# Patient Record
Sex: Female | Born: 1940 | Race: White | Hispanic: No | Smoking: Never smoker
Health system: Southern US, Community
[De-identification: ages and names within clinical notes are randomized; demographics above are authoritative.]

## PROBLEM LIST (undated history)

## (undated) DIAGNOSIS — N390 Urinary tract infection, site not specified: Secondary | ICD-10-CM

## (undated) DIAGNOSIS — I2699 Other pulmonary embolism without acute cor pulmonale: Secondary | ICD-10-CM

## (undated) DIAGNOSIS — J45909 Unspecified asthma, uncomplicated: Secondary | ICD-10-CM

## (undated) DIAGNOSIS — E785 Hyperlipidemia, unspecified: Secondary | ICD-10-CM

## (undated) DIAGNOSIS — F419 Anxiety disorder, unspecified: Secondary | ICD-10-CM

## (undated) DIAGNOSIS — I10 Essential (primary) hypertension: Secondary | ICD-10-CM

## (undated) DIAGNOSIS — K219 Gastro-esophageal reflux disease without esophagitis: Secondary | ICD-10-CM

## (undated) DIAGNOSIS — M797 Fibromyalgia: Secondary | ICD-10-CM

## (undated) DIAGNOSIS — G43909 Migraine, unspecified, not intractable, without status migrainosus: Secondary | ICD-10-CM

## (undated) DIAGNOSIS — M810 Age-related osteoporosis without current pathological fracture: Secondary | ICD-10-CM

## (undated) DIAGNOSIS — N83209 Unspecified ovarian cyst, unspecified side: Secondary | ICD-10-CM

## (undated) DIAGNOSIS — F32A Depression, unspecified: Secondary | ICD-10-CM

## (undated) DIAGNOSIS — F329 Major depressive disorder, single episode, unspecified: Secondary | ICD-10-CM

## (undated) HISTORY — PX: MEDIAL PARTIAL KNEE REPLACEMENT: SHX5965

## (undated) HISTORY — DX: Depression, unspecified: F32.A

## (undated) HISTORY — DX: Unspecified asthma, uncomplicated: J45.909

## (undated) HISTORY — PX: NECK SURGERY: SHX720

## (undated) HISTORY — PX: BACK SURGERY: SHX140

## (undated) HISTORY — DX: Hyperlipidemia, unspecified: E78.5

## (undated) HISTORY — PX: HYSTERECTOMY ABDOMINAL WITH SALPINGECTOMY: SHX6725

## (undated) HISTORY — DX: Unspecified ovarian cyst, unspecified side: N83.209

## (undated) HISTORY — DX: Age-related osteoporosis without current pathological fracture: M81.0

## (undated) HISTORY — PX: APPENDECTOMY: SHX54

## (undated) HISTORY — DX: Migraine, unspecified, not intractable, without status migrainosus: G43.909

## (undated) HISTORY — PX: CHOLECYSTECTOMY, LAPAROSCOPIC: SHX56

## (undated) HISTORY — DX: Urinary tract infection, site not specified: N39.0

## (undated) HISTORY — PX: FEMUR FRACTURE SURGERY: SHX633

## (undated) HISTORY — DX: Essential (primary) hypertension: I10

## (undated) HISTORY — PX: TONSILLECTOMY AND ADENOIDECTOMY: SHX28

## (undated) HISTORY — PX: CHOLECYSTECTOMY: SHX55

## (undated) HISTORY — DX: Anxiety disorder, unspecified: F41.9

## (undated) HISTORY — DX: Gastro-esophageal reflux disease without esophagitis: K21.9

## (undated) HISTORY — DX: Other pulmonary embolism without acute cor pulmonale: I26.99

## (undated) HISTORY — PX: VAGINAL HYSTERECTOMY: SUR661

## (undated) HISTORY — DX: Fibromyalgia: M79.7

## (undated) HISTORY — PX: OTHER SURGICAL HISTORY: SHX169

## (undated) HISTORY — PX: PARTIAL HIP ARTHROPLASTY: SHX733

---
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
## 1998-10-02 ENCOUNTER — Other Ambulatory Visit: Admission: RE | Admit: 1998-10-02 | Discharge: 1998-10-02 | Payer: Self-pay | Admitting: Family Medicine

## 2002-06-26 ENCOUNTER — Inpatient Hospital Stay (HOSPITAL_COMMUNITY): Admission: EM | Admit: 2002-06-26 | Discharge: 2002-07-01 | Payer: Self-pay | Admitting: Psychiatry

## 2002-07-02 ENCOUNTER — Other Ambulatory Visit (HOSPITAL_COMMUNITY): Admission: RE | Admit: 2002-07-02 | Discharge: 2002-07-16 | Payer: Self-pay | Admitting: Psychiatry

## 2004-11-12 ENCOUNTER — Ambulatory Visit: Payer: Self-pay | Admitting: Family Medicine

## 2005-07-11 ENCOUNTER — Ambulatory Visit: Payer: Self-pay | Admitting: Family Medicine

## 2005-07-21 ENCOUNTER — Inpatient Hospital Stay (HOSPITAL_COMMUNITY): Admission: RE | Admit: 2005-07-21 | Discharge: 2005-07-27 | Payer: Self-pay | Admitting: Orthopedic Surgery

## 2005-07-21 ENCOUNTER — Ambulatory Visit: Payer: Self-pay | Admitting: Cardiovascular Disease

## 2005-08-15 ENCOUNTER — Ambulatory Visit: Payer: Self-pay | Admitting: Family Medicine

## 2007-10-23 ENCOUNTER — Inpatient Hospital Stay (HOSPITAL_COMMUNITY): Admission: RE | Admit: 2007-10-23 | Discharge: 2007-10-31 | Payer: Self-pay | Admitting: Orthopedic Surgery

## 2007-10-25 ENCOUNTER — Ambulatory Visit: Payer: Self-pay | Admitting: Vascular Surgery

## 2007-10-25 ENCOUNTER — Encounter (INDEPENDENT_AMBULATORY_CARE_PROVIDER_SITE_OTHER): Payer: Self-pay | Admitting: Orthopedic Surgery

## 2007-10-29 ENCOUNTER — Ambulatory Visit: Payer: Self-pay | Admitting: Physical Medicine & Rehabilitation

## 2008-10-27 IMAGING — CT CT ANGIO CHEST
2 of 5 series · 19 of 36 positions shown · IV contrast (APPLIED)
Comparison: None

CLINICAL DATA: Chest pain, recent knee surgery

CT ANGIOGRAPHY CHEST
TECHNIQUE: Multidetector CT imaging of the chest using the
standard protocol during bolus administration of intravenous
contrast. Multiplanar reconstructed images obtained and reviewed to
evaluate the vascular anatomy.
Contrast: 80 ml Omnipaque 350

[Series 8: pulm embolism 1.0 b25f thins · axial · 0.63mm/px · z∈[-274,-34]mm · 16 of 267 slices shown]
[im 14/267  lung]
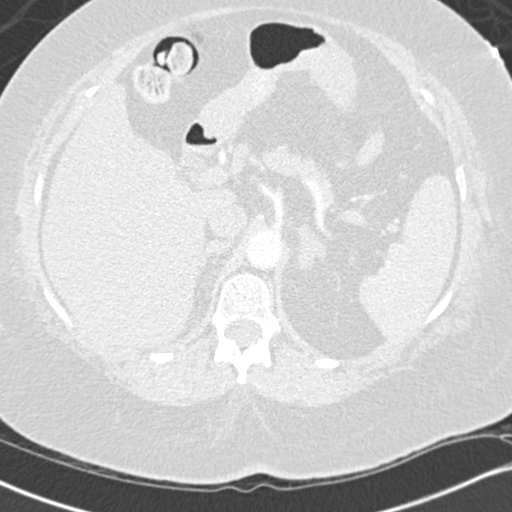
[im 27/267  mediastinal]
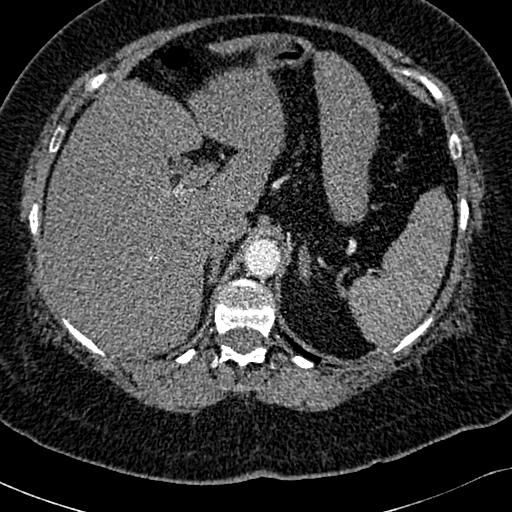
[im 40/267  lung]
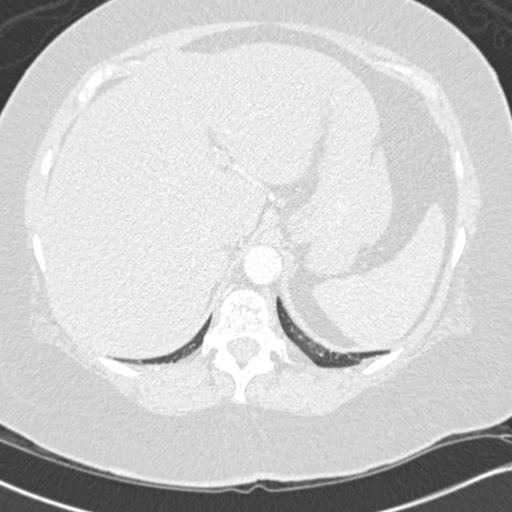
[im 67/267  mediastinal]
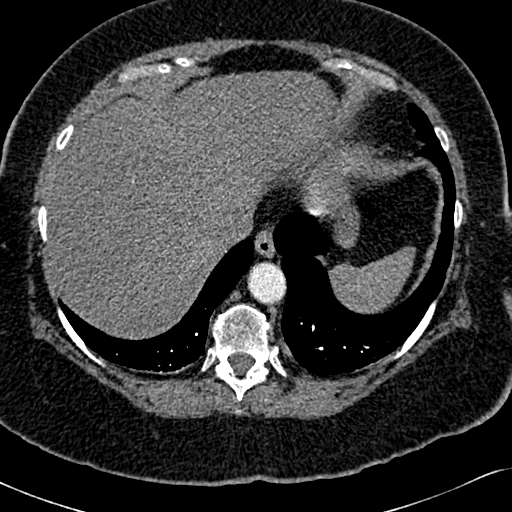
[im 80/267  lung]
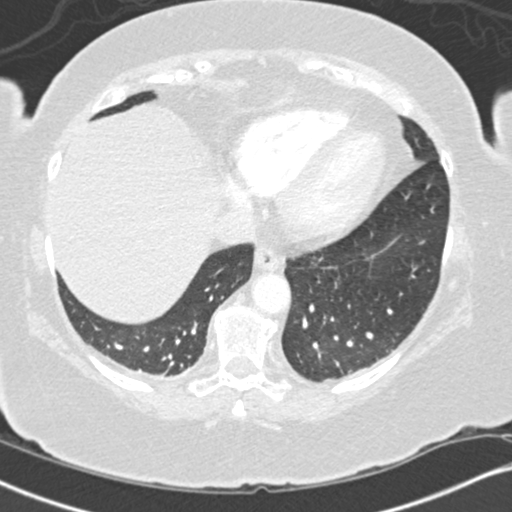
[im 94/267  mediastinal]
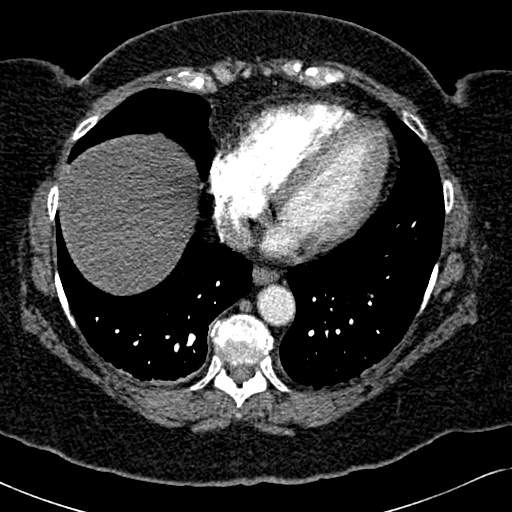
[im 107/267  lung]
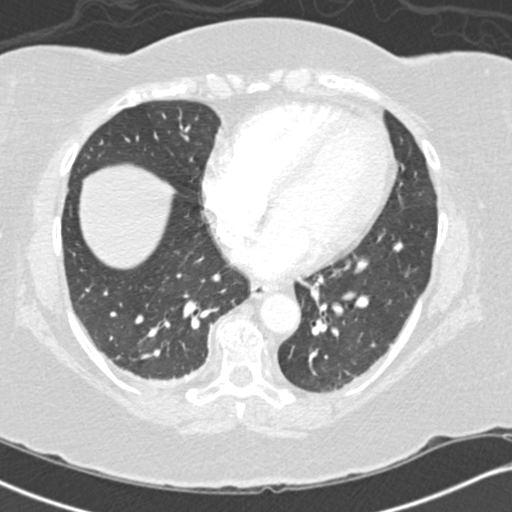
[im 120/267  mediastinal]
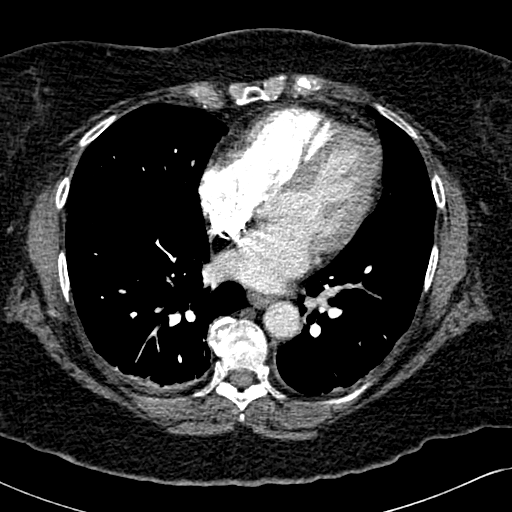
[im 147/267  lung]
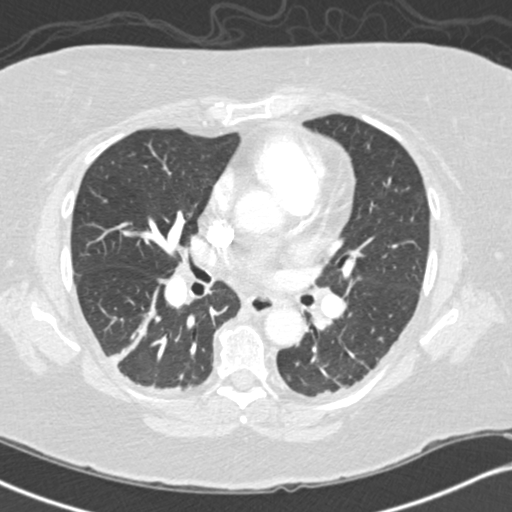
[im 160/267  mediastinal]
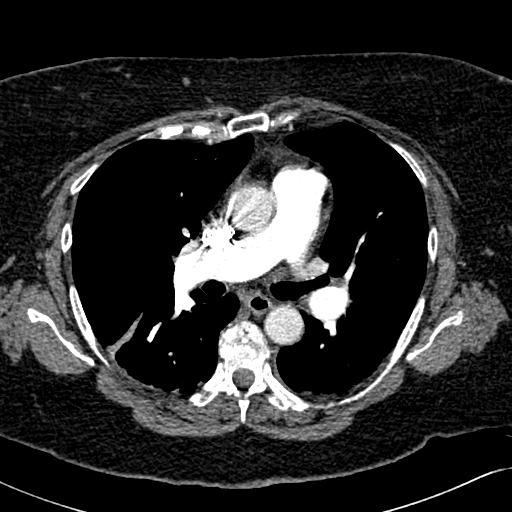
[im 173/267  lung]
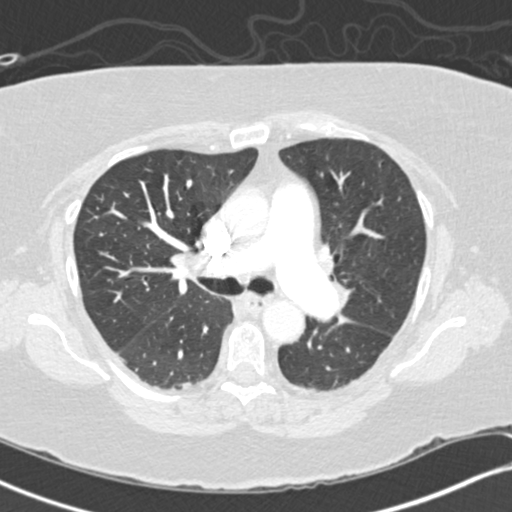
[im 187/267  mediastinal]
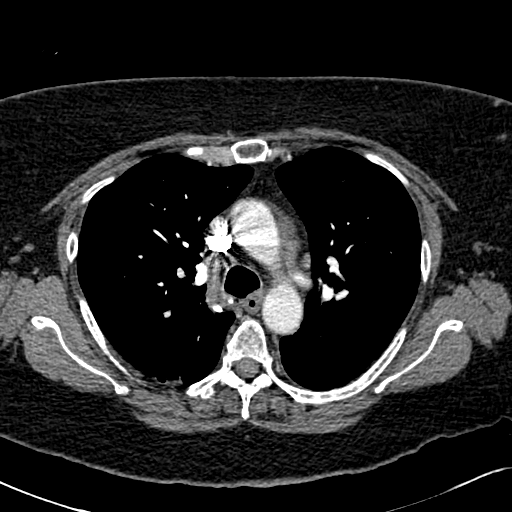
[im 200/267  lung]
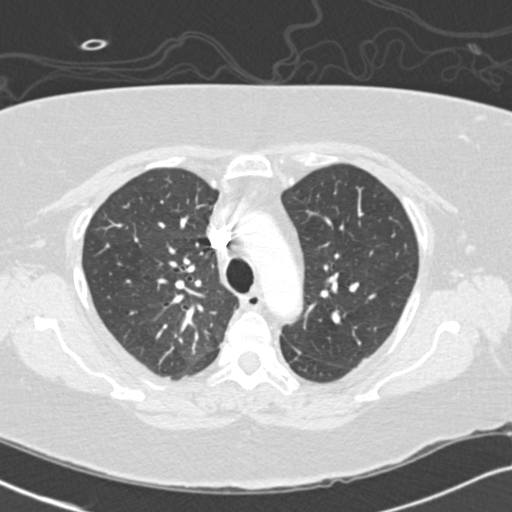
[im 227/267  mediastinal]
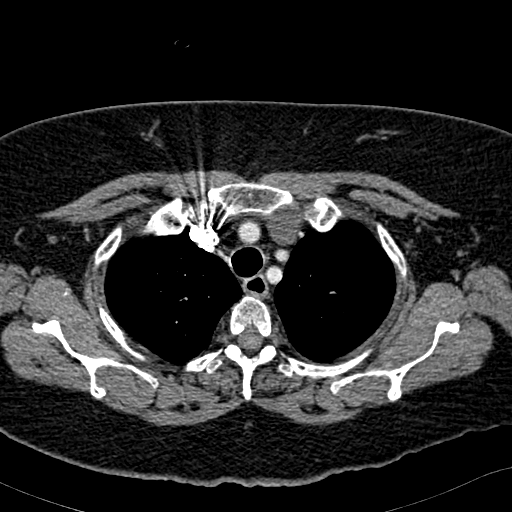
[im 240/267  lung]
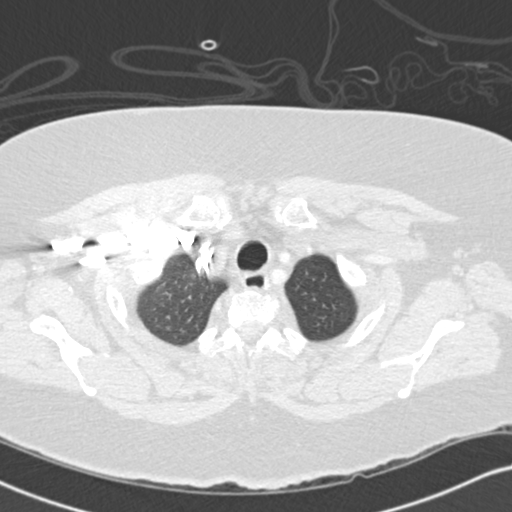
[im 253/267  mediastinal]
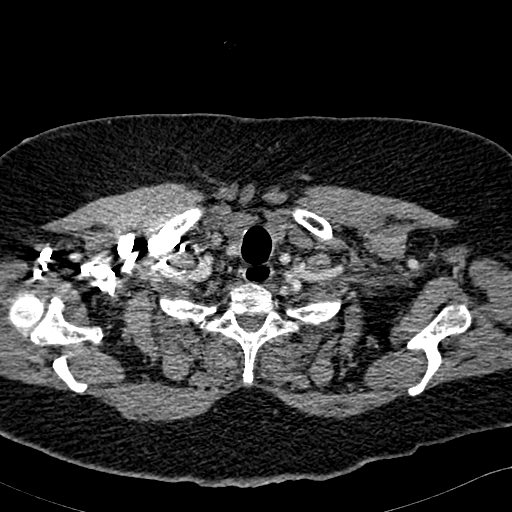

[Series 603: cor · coronal · 0.63mm/px · 3 of 119 slices shown]
[im 24/119  mediastinal]
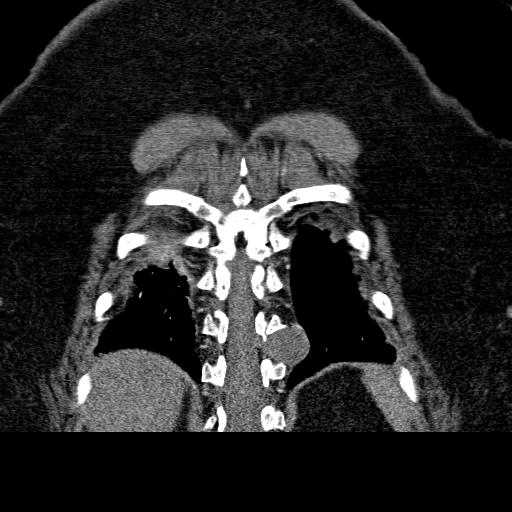
[im 48/119  mediastinal]
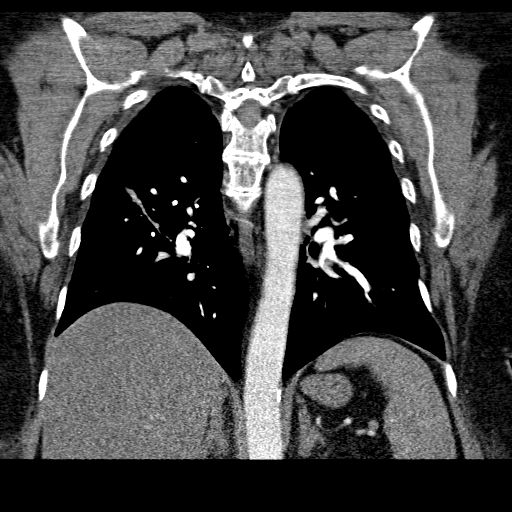
[im 71/119  mediastinal]
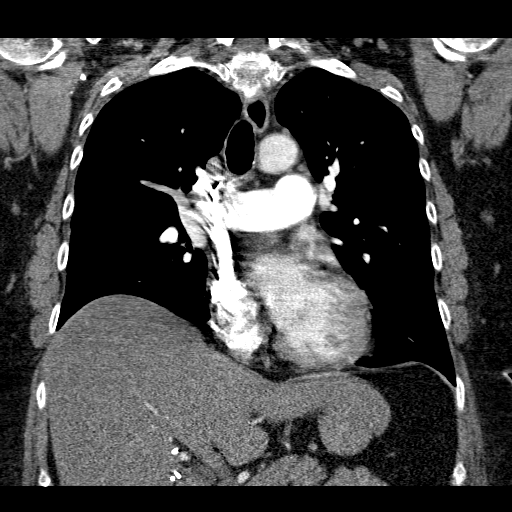

[19 of 36 positions shown; findings below may reference images not displayed]

FINDINGS: There are no filling defects in the pulmonary arterial
tree to suggest acute pulmonary thromboembolism.

There is a 1.3 hypodensity in the posterior right thyroid gland.
Pulmonary nodule may be present.

Negative abnormal mediastinal or hilar adenopathy.  Negative
pericardial effusion.

No pneumothoraces or effusions are seen.

Bibasilar linear atelectasis is present.  There is no definite
consolidation.  Small disc herniations are present in the lower
thoracic spine without obvious cord impingement.
IMPRESSION: No evidence of acute pulmonary thromboembolism.

Bibasilar atelectasis.

1.3 cm right thyroid hypodensity.  Thyroid ultrasound is warranted
to evaluate for nodule.

## 2010-12-07 NOTE — Op Note (Signed)
NAMEJENNY, [name]                  ACCOUNT NO.:  [id number]   MEDICAL RECORD NO.:  [id number]          PATIENT TYPE:  INP   LOCATION:  2899                         FACILITY:  [facility]   PHYSICIAN:  Thereasa Distance A. Mortenson, M.D.DATE OF BIRTH:  January 14, 1941   DATE OF PROCEDURE:  10/23/2007  DATE OF DISCHARGE:                               OPERATIVE REPORT   PREOPERATIVE DIAGNOSIS:  Severe osteoarthritis left knee.   POSTOPERATIVE DIAGNOSIS:  Severe osteoarthritis left knee.   OPERATION:  Left total knee replacements using DePuy total knee, a  standard left femoral component.  All components were cemented, standard  metal back patella, DePuy MBT keel tibial tray size 3 and a 17.5 poly  insert standard size.   SURGEON:  Lenard Galloway. Chaney Malling, M.D.   ASSISTANT:  Arlys John D. Petrarca, P.A.-C.   ANESTHESIA:  General.   PROCEDURE:  The patient placed on the operating table in the supine  position with a pneumatic tourniquet about the left upper thigh.  The  entire left lower extremity was prepped with DuraPrep, draped out in the  usual manner.  Leg was wrapped out with Esmarch, tourniquet was  elevated.  Incision made starting above the patella and carried down to  the tibial tubercle.  A long medial parapatellar incision was then made.  Self-retaining retractor was put in place.  Bleeders were coagulated.  Extensive synovectomy was then done.  Knee was flexed and both medial  and lateral meniscus and cruciate ligaments were excised.  There were  severe degenerative changes throughout the knee.  Rongeurs were used to  remove large multiple osteophytes.  Once this was accomplished to my  satisfaction, Schanz pins were placed in the distal femur and proximal  tibia in the standard manner and the femoral and tibial arrays for the  computer were then applied and tightened.  We then started registration  to define the mechanical axis.  The femoral head center was defined  first, followed by the tibial  mechanical axis, we followed all computer  promptings and defined the proximal tibial plateau.  Then we defined the  distal femoral condyles, epicondyles anterior aspect of the femur  rotational positions.  Following all the computer promptings, this was  placed in the computer.  We then started tibial planning using computer  suggestions.  Tibial resection settings were then registered and the  tibial cutting block was placed over the proximal end of the tibia and  adjusted according to the promptings and fixed in place with fixation  pins.  The proximal tibia was then amputated at the appropriate level  and this was excised.  Confirmation guide showed a perfect cut in the  proximal tibia.  Soft tissue balancing was then done in flexion using  the tension device.  It was slightly tight on the medial side so soft  tissue release was done.  Once this was accomplished, there was actually  perfect balancing of flexion extension of soft tissues.  Femoral implant  planning was then done and registered in a standard manner following all  the computer promptings.  Then the  anterior and posterior femoral  resection was done the cutting block #1 using the computer to set this  up properly and this was pinned in place with fixation pins, anterior  and posterior condyles were then amputated.  The distal femur cutting  block was placed in position following computer promptings and  stabilized with fixation pins and distal end of femur was resected.  The  confirmation guide showed excellent position.  A 12.5 mm insert was  inserted  and there was excellent balancing in both flexion and  extension.  The final femoral chamfer cutting block was placed over the  distal end of femur and fixed in place with fixation pins.  Chamfer cuts  made, drill holes were made.  All debris was then removed from the knee.  Attention was then turned to the proximal tibia and this was subluxed  anteriorly.  A size 3 tibial  tray seated very nicely, was locked in  place.  The tibial tower was put in place and drill was placed in the  tower and holes made in the proximal tibia.  The wing guide was then  driven down into the tibia.  The trial tibial component was then put in  place.  The trial femoral components put in place and a 12.5 mm poly was  inserted.  Osteophytes removed.  The knee was then put through full  range of motion.  There was some hyperextension, a little instability  and the trial was increased in size and the 17 mm trial fit very nicely.  There was full flexion, full extension, good stability, no drawer sign,  no spitting of the poly.  This was felt to be perfectly balanced both in  flexion and extension.  All the trial components removed.  Pulsating  lavage was used.  All debris was removed.  Glue was mixed and each  component was inserted sequentially, first with the tibia and then the  distal femur with the trial poly in place.  Excess glue was removed in  standard manner.  The posterior aspect of patella had been cut and drill  holes placed in the posterior aspect of the patella and the patellar  trial was inserted and held in place with clamp as the glue set up.  Once the glue had stabilized, excess glue was removed.  The trial polys  were inserted and there was excellent flexion and extension, excellent  balancing, I was quite pleased with the construct.  The trial poly was  removed.  Tourniquet was dropped.  Bleeders were coagulated.  FloSeal  was used in the wound which controlled bleeding very nicely as was the  Bovie.  Hemovac drain was inserted.  The long medial parapatellar  incision was then closed with interrupted Tycron.  Vicryl was used to  close the subcutaneous tissue and stainless steel staples used to close  the skin.  Sterile dressings were  applied.  The patient returned to the  recovery room in excellent condition.  Technically, I was extremely  pleased with the  construct, the resulting stability and range of motion  and alignment.   DRAIN:  Hemovac.   COMPLICATIONS:  None.      Rodney A. Chaney Malling, M.D.  Electronically Signed     RAM/MEDQ  D:  10/23/2007  T:  10/23/2007  Job:  [id number]

## 2010-12-10 NOTE — Consult Note (Signed)
[name], SHEFFER                  ACCOUNT NO.:  [id number]   MEDICAL RECORD NO.:  [id number]          PATIENT TYPE:  INP   LOCATION:  1515                         FACILITY:  [facility]   PHYSICIAN:  Genene Churn. Love, M.D.    DATE OF BIRTH:  02/03/1941   DATE OF CONSULTATION:  07/23/2005  DATE OF DISCHARGE:                                   CONSULTATION   This 70 year old right-handed white married female from [place], [place] seen on an emergency basis, called code stroke, to evaluate new  onset speech disturbance.   HISTORY OF PRESENT ILLNESS:  Ms. [name] has a known prior history of  hypertension but no known history of diabetes mellitus, heart disease, or  stroke.  She does have a history of migraine headaches.  She had been in her  usual state of health and was admitted July 21, 2005 for right total  knee arthroplasty.  She was on heparin 5000 units q.12h. and Coumadin  protocol.  About 6:50 a.m. on July 23, 2005, she complained of pain and  received two Percocet.  She was seen by Dr. Darrelyn Hillock at about 8:00 a.m. and  was noted to be lethargic.  She was seen by OT at 8:30 a.m. and thought to  be aphasic.  Code stroke was called at 9:20 a.m.  When I arrived on the  floor, patient was in CT scan.  She had been at times talking with the  nurses.  The patient returned from CT scan and was able to give some  history.  Denied headaches, chest pain, or palpitations.  She does have a  history of migraine headaches but no documented aphasias.   PAST MEDICAL HISTORY:  1.  Obesity.  2.  Hypertension.  3.  Anxiety.  4.  Depression.  5.  Irritable bowel syndrome.  6.  Fibromyalgia.  7.  Migraine headaches.  8.  Gastroesophageal reflux disease.  9.  Pulmonary emboli.  10. Lumbar spine surgery.  11. Right hip arthroplasty.   MEDICATIONS ON ADMISSION:  1.  Effexor 75 mg p.o. q.a.m.  2.  Effexor 37.5 mg p.o. q.p.m.  3.  Seroquel 300 mg p.o. q.p.m.  4.  Toprol XL 100 mg p.o.  daily.  5.  Detrol LA 4 mg daily.  6.  Lorazepam 1 mg nightly.  7.  Advil 2 tablets p.o. b.i.d.   MEDICATIONS IN THE HOSPITAL:  1.  Heparin 5000 units q.12h.  2.  Coumadin protocol, per pharmacy.  Her Coumadin dose was 5 mg p.o. daily.  3.  Hydromorphone concentrate.  4.  Dilaudid pump, which was not going this morning at the time she asked      for the Percocet.  5.  She has also been receiving Percocet.  6.  Nasal O2.   PHYSICAL EXAMINATION:  GENERAL:  A well-developed, obese white female.  She  was alert and oriented x3, except at times she had difficulty knowing where  she was.  She followed 1, 2, and 3 step commands.  There was no __________  she could name and she  could repeat.  An NIH stroke scale was a 1 for her  aphasia.  VITAL SIGNS:  Blood pressure in her right arm was 160/69, heart rate 64.  NEUROLOGIC:  Cranial nerve examination revealed visual fields full, disks  flat, spontaneous pulsations seen, extraocular movements full, corneals  present.  Facial sensation equal.  No facial motor asymmetry.  Hearing  intact.  Air conduction greater than bone conduction.  Tongue midline.  Gag  is present.  Motor examination:  She moved her right arm, left arm, and left  leg well.  She had some pain when she moved her left leg and could not move  it well; therefore, was a *9.  She had no ankle jerks and downgoing plantar  responses.  Pinprick was equal in the arms, face, and legs.   A 12-lead EKG showed normal sinus rhythm.  A normal EKG.   CBC and basic metabolic panel are pending.   CT scan of the brain was interpreted as showing decreased density in the  right parietal region, but to my eye, it did not show any definite evidence  of an ischemic stroke.   IMPRESSION:  1.  Aphasia (Code 784.3) with an NIH stroke scale with a *9 for right leg      movement, most likely transient ischemic attack (Code 435.9).  2.  Current heparin and Coumadin therapy.  3.  Hypertension (Code  796.2).  4.  Obesity (Code 278.01).   PLAN:  Place the patient in the sub-stepdown unit and obtain MRI/MRA with  intracranial and extracranial MRA and give her rectal aspirins.  She will be  kept n.p.o.           ______________________________  Genene Churn. Sandria Manly, M.D.     JML/MEDQ  D:  07/23/2005  T:  07/24/2005  Job:  [id number]

## 2010-12-10 NOTE — Discharge Summary (Signed)
[name], [name]                  ACCOUNT NO.:  [id number]   MEDICAL RECORD NO.:  [id number]          PATIENT TYPE:  INP   LOCATION:  1506                         [facility]ACILITY:  [facility]   PHYSICIAN:  Georges Lynch. Gioffre, M.D.DATE OF BIRTH:  07/07/1941   DATE OF ADMISSION:  07/21/2005  DATE OF DISCHARGE:  07/27/2005                                 DISCHARGE SUMMARY   ADMISSION DIAGNOSES:  1.  Severe osteoarthritis bilateral knees, right more symptomatic than left.  2.  Obesity.  3.  Hypertension.  4.  History of pulmonary emboli.  5.  History of anxiety/depression/sleep difficulty.  6.  History of fibromyalgia.  7.  History of migraines.  8.  History of hiatal hernia/reflux.  9.  History of irritable bowel.  10. History of shortness of breath with exertion.   DISCHARGE DIAGNOSES:  1.  Right total knee arthroplasty.  2.  Aphasia with probable transient ischemic attack nonrecurrent and      completely resolved.  3.  Exacerbation of depression.  4.  Postoperative blood loss anemia not requiring blood transfusion.  5.  Obesity.  6.  Hypertension.  7.  History of pulmonary emboli.  8.  History of migraines.  9.  History of hiatal hernia with reflux disease.  10. History of fibromyalgia.  11. History of irritable bowel syndrome.   HISTORY OF PRESENT ILLNESS:  The patient is a 70 year old female patient of  Dr. Darrelyn Hillock who is evaluated the past several years for bilateral knee pain,  right knee has failed conservative treatment including injections with  medications. She has chronic pain, difficulty with ambulation, it bothers  her at night, affects her activities of daily living due to the discomfort.  Currently she has end-stage osteoarthritis bilateral knees.   ALLERGIES:  SULFA.   CURRENT MEDICATIONS:  1.  Effexor 75 mg p.o. q.a.m.  2.  Effexor 37.5 mg p.o. q.p.m.  3.  Seroquel 300 mg q.p.m.  4.  Toprol XL 100 mg p.o. daily.  5.  Detrol LA 4 mg a day.  6.  Lorazepam 1 mg  nightly.  7.  Advil 2 tablets p.o. b.i.d.   PROCEDURE:  On July 21, 2005, the patient was taken to the OR by Dr. Worthy Rancher assisted by Oneida Alar, PA-C under general anesthesia. The patient  underwent a right total knee arthroplasty right DePuy components. Estimated  blood loss was 100 mL. The following components were implanted:  A size 3  right femoral component cemented, a size 3 keel tibial tray cemented, a size  3, 12.5 polyethylene bearing and a size 32 mm 3 peg patella. The components  were implanted with polymethyl methacrylate and vancomycin then transferred  to the recovery room and then to the orthopedic floor in good condition.   CONSULTATIONS:  The following routine consults requested:  Physical therapy,  occupational therapy, case management.   A medical consult was requested by the hospitalist for evaluation of the  patient's medical issues and the management of.   A neurology consult with Dr. Sandria Manly was consulted for a questionable stroke.  HOSPITAL COURSE:  On July 21, 2005, the patient was admitted to [facility] in the care of Windy Fast A. Darrelyn Hillock, M.D. The patient was taken  to the OR where a right total knee arthroplasty was performed without any  complications. The patient tolerated the procedure well and was transferred  to the recovery room and to the orthopedic floor in good condition with IV  pain medicines, antibiotics, started on heparin and Coumadin for DVT  prophylaxis and to follow routine total knee protocol. The patient then  incurred a total of 5 days postoperative course in the hospital where the  first two days were pretty much uneventful. She was able to transition from  the IV medications to p.o. medication well. She tolerated the CPM well.  There were no specific complaints but her hemoglobin did drop to 9.8 but her  vital signs and her activities were able to tolerate with physical therapy  so no transfusions were necessary. Later  in the day on postoperative day #2,  the patient was noted to be slightly aphasic. PA and medical services called  for evaluation. A code stroke was requested and Dr. Sandria Manly then was evaluating  the patient with CT of the head, MRI and MRA. The patient was already on  Coumadin therapy and this was continued. The patient's aphasia over a period  of time did resolve and by postoperative day #3 and 4 it had completely  resolved and she was asymptomatic without any recurrence. Per Dr. Sandria Manly, the  patient's CT scan interpreted by the radiologist to have some decreased  density in the right parietal region. Dr. Sandria Manly reviewed it and there was no  definite evidence of ischemic stroke. The patient's MRI/MRA showed no areas  of ischemia per Dr. Sandria Manly and no further treatment was necessary due to this  did resolve and she had no recurrence during her hospitalization and no  recommendations for followup were provided.   On postoperative day #4, the patient was slightly teary eyed indicating that  she was having some difficulty with her depression. I did discuss this with  the patient and she did not elect to have a consult by psychiatry but she  did give me her psychiatrist's name and it was Dr. Jennelle Human. I reviewed the  case with him over the phone. He gave recommendations for adjustment of the  patient's Effexor and he would like to see her in a followup as soon as she  left the hospital. This was relayed to the patient and she was comfortable  with the current plan. On postoperative day #5, the patient was feeling a  little bit better with her medication adjustment. She was not as down as she  was the day previous. Her wound was benign for any signs of infection, she  had no other complaints or problems and she had worked well with physical  therapy and she was now able to get herself in and out of the chair and  ambulate fairly easily. So she was discharged home in good condition with arrangements made  for home health physical therapy and followup with her  medical doctors. She was discharged in good condition.   LABORATORY DATA:  CBC on January 1, WBCs 5.8, hemoglobin 24.6, platelets 171  with an INR of 1.9. Her routine chemistries on January 1 showed sodium of  139, potassium of 3.4, glucose 103, BUN 10, creatinine 1.0. Doppler studies  of carotids done on January 2 showed no ICA stenosis  and vertebral artery  flow was antegrade bilaterally. The summary of a transthoracic  echocardiogram done on January 3 prior to discharge showed left ventricular  ejection fraction estimated at 55-65% with left ventricular wall thickness  to be mildly increased. Aortic valve was mildly calcified. There was left  pleural effusion. No source of emboli. EKG on July 23, 2005 with normal  sinus rhythm at 76 beats per minute. Chest x-ray on December 18 showed  negative cardiac or pulmonary process. CT of the head on December 30 showed  subtly low attenuation in the right posterior parietal lobe worrisome for  acute stroke. There is no associated hemorrhage or hematoma. Mild chronic  bilateral maxillary sinusitis.   No print out of patient's MRI/MRA was in the chart on dictation.   MEDICATIONS ON DISCHARGE FROM ORTHOPEDICS:  1.  Ferrous sulfate 325 mg p.o. t.i.d.  2.  Percocet 1 or 2 tablets every 4-6 hours p.r.n.  3.  Reglan 10 mg p.o. q.8 h p.r.n.  4.  Phenergan 25 p.o. q.6 h p.r.n.  5.  Robaxin 500 mg p.o. q.6 h p.r.n.  6.  Lorazepam 1-2 mg p.o. q.h.s. p.r.n.  7.  Detrol 4 mg p.o. daily.  8.  Toprol XL 100 mg p.o. daily.  9.  Seroquel 300 mg p.o. q.h.s.  10. Tylenol 650 mg p.o. q.6 h p.r.n.  11. Lisinopril 10 mg p.o. daily.  12. Effexor 150 mg p.o. daily.   DISCHARGE INSTRUCTIONS:  1.  Diet no restrictions.  2.  Activity - the patient is to walk with the assistance of the use of a      walker.  3.  Wound care - the patient is to change dressing on a daily basis.  4.  Medications - the  patient is to resume routine home meds with the      exception of the following:  5.  The Effexor dose is changed to 150 mg a day.  6.  Percocet 1 or 2 tablets every 4-6h p.r.n. pain.  7.  Robaxin 500 mg p.o. q.6 h p.r.n.   FOLLOW UP:  1.  The patient should contact Dr. Jeannetta Ellis office for a followup      appointment in one week.  2.  The patient is to contact Dr. Alwyn Ren office for a followup appointment      the following week.  3.  The patient is to followup with Dr. Sandria Manly in one month. Please call to      make an appointment [phone].  4.  [facility] for physical therapy and INR monitoring.   CONDITION ON DISCHARGE:  Improved and good.      Jamelle Rushing, P.A.    ______________________________  Georges Lynch Darrelyn Hillock, M.D.    RWK/MEDQ  D:  08/04/2005  T:  08/05/2005  Job:  [id number]   cc:   Genene Churn. Love, M.D.  Fax: [phone]   Jetty Duhamel., M.D.  Fax: [phone]   Gilford Rile. Benedetto Goad, M.D.  Fax: [phone]

## 2010-12-10 NOTE — Discharge Summary (Signed)
NAMEDEBRALEE, BRAAKSMA                  ACCOUNT NO.:  [id number]   MEDICAL RECORD NO.:  [id number]          PATIENT TYPE:  INP   LOCATION:  5023                         FACILITY:  [facility]   PHYSICIAN:  Thereasa Distance A. Mortenson, M.D.DATE OF BIRTH:  [birth year]   DATE OF ADMISSION:  10/23/2007  DATE OF DISCHARGE:  10/31/2007                               DISCHARGE SUMMARY   ADMISSION DIAGNOSIS:  Osteoarthritis left knee.   DISCHARGE DIAGNOSES:  1. Osteoarthritis left knee.  2. Obesity.  3. History of postop delirium.  4. History of hypertension.  5. Post-hemorrhagic anemia.  6. Hyperosmolality  7. Hypoxia.  8. Depressive disorder.  9. Esophageal reflux.  10.Obstructive sleep apnea.  11.Personal history of venous thrombosis and embolism.   PROCEDURE:  Left total knee arthroplasty.   HISTORY:  Chi is a 70 year old white female with a history of left knee  pain for many years.  She had an arthroscopy done 21 years ago, status  post right total knee arthroplasty and total hip arthroplasty.  Now,  having constant severe left knee pain mainly they are aching with a  sharp pain.  It is worsening now especially with activity of daily  living.  Failed conservative treatment.  She has tried viscous  supplementation.  Radiographic osteoarthritis of left knee.  Indicated  now for total knee arthroplasty.   HOSPITAL COURSE:  A 70 year old female admitted on October 23, 2007, after  appropriate laboratory studies were obtained as well as 2 g of Ancef IV  on-call at the operating room, was taken to the operating room where she  underwent a total knee replacement on the left knee.  She tolerated the  procedure well.  She was placed on Dilaudid PCA pump reduced dose.  Ancef 1 g IV q.6 h x3 doses was ordered.  Started on Arixtra 2.5 mg  subcu beginning at 8 a.m. on October 24, 2007.  Consults for PT/OT care  management were made.  Allowing 50% weightbearing with her walker.  Following day, she was weaned off  her O2, keeping her sats greater than  92%.  On postop day #2, her dressings were changed.  A Doppler of the  left lower extremity was ordered to rule out DVT.  She also had some  difficulties with desaturation and a chest x-ray was ordered and  respiratory therapy for pulmonary toilet.  ABGs on room air were  ordered.  CT angiogram was ordered as well as stat BNP and cardiac  enzymes.  Percocet was discontinued and she was placed on Norco 5/325,  1-2 tablets every four hours as needed for pain.  Hospice was consulted.  The patient was trialed with albuterol and Atrovent breathing  treatments.  A trial of CPAP for respiratory therapy was performed.  She  did have improvement in her saturations.  On October 27, 2007, an order was  written for overnight room air oximetry by respiratory.  On October 29, 2007, she was noted to have hypokalemia and 20 mEq of KCl was given p.o.  b.i.d..  She was then allowed to be weightbearing as  tolerated on October 30, 2007.  She had markedly improved with her physical therapy to the  point instead of having to go to a rehab or SNF, she had marked  improvement.  Remainder of her hospital course was uneventful and she  was discharged on October 31, 2007, to return back to the office in  followup.  EKG of October 23, 2007, reveals normal sinus rhythm with  inferior infarct, age undetermined.  Cannot rule out anterior infarct.  Chest x-ray of October 25, 2007, revealed limited study by poor  inspiration, no active disease.  CTA revealed no evidence of acute  pulmonary thromboembolism.  Bibasilar atelectasis is noted.  There is  1.3-cm thyroid hypodensity.   LABORATORY STUDIES:  Blood gas of October 25, 2007:  A pH 7.358, pCO2  60.5, pO2 42.6, bicarb 33.1, pCO2 35, base excess 7.7, O2 sat 79.3%.  Admission hemoglobin 13.7,  hematocrit 40.5%, white count 7900, and  platelet 230,000.  Discharge hemoglobin 10.2, hematocrit 29.9%, white  count 7200, platelets 259,000.  Preop  sodium 139, potassium 4.0,  chloride 102, CO2 29, glucose 94, BUN 15, and creatinine 1.10.  Discharge sodium 139, potassium 3.7, chloride 100, CO2 29, glucose 109,  BUN 8, and creatinine 0.96.  Admission GFR was 49.  Discharge GFR was  58, total protein 6.5, albumin 3.8, AST 23, ALT 22, ALP 129, and total  bilirubin 0.9.  CK on October 25, 2007, was 140, MB 1.7, index 1.2, and  troponins 0.01.  Urinalysis was benign for voided urine.  Blood type is  A negative.  Antibody screen negative.  Urine culture 65,000 colonies  multiple bacterial morphotype.   DISCHARGE INSTRUCTIONS:  No restriction on diet.  Increase activity  slowly.  Use crutches or walker.  May be weightbearing as tolerated.  May shower now.  No lifting or driving for 6 weeks.  CPM 0-90 degrees  for 6-8 hours per day and increasing by 5-10 degrees daily.  Follow up  wound instruction sheet.  Change dressing daily.   DISCHARGE MEDICATIONS:  1. Aspirin 81 mg once daily.  2. Percocet 5/325 1-2 tabs every 4 hours as needed for pain.  3. Robaxin 500 mg 1 tablet every 8 hours as needed for her spasms.   FOLLOWUP:  Follow up back with Dr. Chaney Malling on November 05, 2007.  Discharged in improved condition.      Oris Drone Petrarca, P.A.-C.      Rodney A. Chaney Malling, M.D.  Electronically Signed    BDP/MEDQ  D:  12/18/2007  T:  12/19/2007  Job:  [id number]

## 2010-12-10 NOTE — H&P (Signed)
[name], [name]                  ACCOUNT NO.:  [id number]   MEDICAL RECORD NO.:  [id number]          PATIENT TYPE:  INP   LOCATION:  NA                           [facility]ACILITY[facility]  [facility]   PHYSICIAN:  Georges Lynch. Gioffre, M.D.DATE OF BIRTH:  Aug 06, 1940   DATE OF ADMISSION:  07/21/2005  DATE OF DISCHARGE:                                HISTORY & PHYSICAL   CHIEF COMPLAINT:  Bilateral knee pain, right greater than left.   HISTORY OF PRESENT ILLNESS:  The patient is a 70 year old female patient of  Dr. Jeannetta Ellis evaluated and monitored in the past and treated for bilateral  knee pain.  Right knee has failed conservative treatment.  She has chronic  pain, difficulty with ambulation.  It bothers her at night.  She is effected  with activities of daily living due to the discomfort.  Currently, she has  severe end-stage osteoarthritis of bilateral knees, right is more  symptomatic than the left.   ALLERGIES:  SULFA.   CURRENT MEDICATIONS:  1.  Effexor 75 mg p.o. q.a.m.  2.  Effexor 37.5 mg p.o. q.p.m.  3.  Seroquel 300 mg p.o. q.p.m.  4.  Toprol XL 100 mg p.o. daily.  5.  Detrol LA 4 mg a day.  6.  Lorazepam 1 mg nightly.  7.  Advil two tablets p.o. b.i.d.   PAST MEDICAL HISTORY:  1.  Obesity.  2.  Hypertension.  3.  Anxiety and depression.  4.  Difficulty with sleep on Seroquel.  5.  History of migraines.  6.  History of hiatal hernia with reflux.  7.  History of irritable bowel with constipation.  8.  History of pulmonary emboli 3 years previous on a year and a half of      Coumadin therapy.  9.  History of right hip fracture with hemiarthroplasty.   PAST SURGICAL HISTORY:  1.  Tonsillectomy.  2.  Appendectomy.  3.  Hysterectomy.  4.  Lumbar surgery.  5.  Breast surgery.  6.  Parotid gland surgery.  7.  Hip bipolar.  8.  The patient indicates the complications of the above mentioned surgical      procedures includes difficult intubations in the past.   FAMILY HISTORY:   Father is deceased from a catastrophic pulmonary emboli.  Mother is deceased with a history of hypertension and arthritis.   SOCIAL HISTORY:  The patient is a 70 year old white female, obese, married.  She lives with her husband in a one-story house.  She denies any history of  smoking or alcohol use.   REVIEW OF SYSTEMS:  Positive for shortness of breath with exertion.  She  does have shortness of breath intermittently while lying down flat over the  last 3 years.  Sitting up does improve it.  She does have occasional  problems with constipation.  She relates irritable bowel syndrome.  She uses  occasional TUMS for her intermittent reflux.  She does have a history of  pulmonary emboli in 2003 after a severe flu bout being bed rest with  resultant therapy on Coumadin for a year  and a half.   PHYSICAL EXAMINATION:  VITAL SIGNS:  Height is 5 feet, weight is 200 pounds,  temperature 98.9, blood pressure 144/96, pulse 80 and regular, respirations  14.  GENERAL:  This is a short in stature, obese, white female, conscious, alert,  and appropriate.  She does speak with very short speech.  She does  occasionally appear like she does have some shortness of breath.  She does  have a difficult time getting on and off the examination table.  HEENT:  Head was normocephalic.  Pupils equal, round, and reactive to light.  Extraocular movements intact.  Sclerae anicteric.  Gross hearing is intact.  NECK:  Supple.  No palpable lymphadenopathy.  The patient had good range of  motion of her cervical spine without difficulty or tenderness.  CHEST:  Lung sounds are clear and equal bilaterally.  No wheezes, rales, or  rhonchi.  HEART:  Regular rate and rhythm with no murmurs, rubs, or gallops.  ABDOMEN:  Round, obese, soft, nontender.  Normal active bowel sounds.  EXTREMITIES:  Upper extremities were symmetrically size and shape.  She had  good range of motion of her shoulders, elbows, and wrists.  Motor  strength  is 5/5.  Lower extremities; right and left hip had full extension.  She was  able to flex the right up to about 100 degrees, left up to 130.  She did  have some sore internal and external rotation with a right hip, none with  her left.  Bilateral knees were round and boggy appearing.  There was no  erythema or ecchymosis, no effusions.  She did have crepitus on both  patellae.  Right knee lacked about 5 degrees of extension, flexed back to  100.  Left knee actually lacked about 10 degrees of extension.  She was able  to flex it back to about 100.  There was no instability.  The calves were  nontender.  The ankles were full, but good dorsoplantar flexion.  PERIPHEROVASCULAR:  Carotid pulses were 2+ with no bruits.  Radial pulses  were 2+.  Dorsalis pedis pulses and posterior tibial pulses were not  palpable.  She had dry skin, brisk capillary refill, no venous stasis  changes in the lower extremity.  NEUROLOGY:  No significant signs of any neurologic defects.  She was  conscious, alert, and appropriate.   IMPRESSION:  1.  Severe osteoarthritis bilateral knees, right more symptomatic than left.  2.  Obesity.  3.  Hypertension.  4.  History of pulmonary emboli.  5.  History of anxiety/depression/sleep difficulty.  6.  History of fibromyalgia.  7.  History of migraines.  8.  History of hiatal hernia/reflux disease.  9.  History of irritable bowel syndrome/constipation.  10. History of shortness of breath with exertion and lying flat.   PLAN:  The patient was to her primary care physician, Gilford Rile. Benedetto Goad,  M.D., earlier this morning for her preoperative evaluation.  I have not  received any of this back yet.  She also indicates that she is to have a  cardiac stress test as of this coming Wednesday.  We will also have  anesthesia evaluate her due to history of difficult intubations in the past. Other than that, we will get routine preoperative labs and tests.  The  patient  is expected to have a right total knee arthroplasty by Dr. Darrelyn Hillock  on July 21, 2005.      Jamelle Rushing, P.A.    ______________________________  Ronald A. Darrelyn Hillock, M.D.    RWK/MEDQ  D:  07/11/2005  T:  07/12/2005  Job:  [id number]

## 2010-12-10 NOTE — H&P (Signed)
[name], [name]                            ACCOUNT NO.:  [id number]   MEDICAL RECORD NO.:  [id number]                   PATIENT TYPE:  IPS   LOCATION:  0501                                 FACILITY:  [facility]   PHYSICIAN:  [name], M.D.              DATE OF BIRTH:  06-15-1941   DATE OF ADMISSION:  06/26/2002  DATE OF DISCHARGE:                         PSYCHIATRIC ADMISSION ASSESSMENT   IDENTIFYING INFORMATION:  The patient is a 70 year old married white female  voluntarily admitted on June 26, 2002.   HISTORY OF PRESENT ILLNESS:  The patient presents with a history of  depression and anxiety.  She reports that her psychiatrist had recently made  a medication change.  She has been tapering off her Serzone with Remeron  being recently started.  The patient reports she has been increasingly  depressed, feeling very low and withdrawn with decreased sleep, feeling  anxious.  She states she has been shaking for the past four days.  She has  a decreased appetite but no apparent weight loss.  She states her last  Serzone was on Saturday prior to arrival.  She has been on this medication  for the past four years and states it was no longer effective, which she  states was the reason for the new medication change.  She denies any  suicidal or homicidal ideation or psychosis and states that she did sleep  fairly well last night.   PAST PSYCHIATRIC HISTORY:  She sees [name], M.D., as an outpatient.  This is the first admission to [facility].  She has no other  inpatient admissions, no history of a suicide attempt.   SUBSTANCE ABUSE HISTORY:  She is a nonsmoker.  She denies any alcohol or  substance abuse.   PAST MEDICAL HISTORY:  Primary care : [name], M.D., in  [place].  Medical problems: Fibromyalgia, hypertension, and headache.   MEDICATIONS:  1. Inderal 20 mg t.i.d.  2. Remeron 30 mg at h.s.; she started this one week ago.  3.  Ativan b.i.d.   DRUG ALLERGIES:  ZOLOFT, the patient states she got crazy while she was on  it, and SULFA.   REVIEW OF SYSTEMS:  She has a history of hypertension.  She states she gets  short of breath with exertion.  She has had some recent confusion with  removal of her medications.  No blood dyscrasias or endocrine problems.  She  gets some diarrhea when she gets anxious.  She has a history of  fibromyalgia.  She takes Advil for her pain.   PHYSICAL EXAMINATION:  GENERAL:  The patient is in no acute distress.  VITAL SIGNS:  Temperature 98, heart rate 69, respiratory rate 16, blood  pressure 100/77.  She weighs 189 pounds.  She is 5 feet 5 inches tall.  HEENT:  Head: Normocephalic, atraumatic.  NECK:  Negative lymphadenopathy.  CHEST:  Clear  to auscultation.  HEART:  Regular rate and rhythm.  ABDOMEN:  Soft.  MUSCULOSKELETAL:  Strong and equal musculoskeletal strength.  SKIN:  Warm and dry.  NEUROLOGIC:  No tics or tremors were noted.   LABORATORY DATA:  CBC is within normal limits.  CMET: Blood sugar 128.  TSH  is 2.841.   SOCIAL HISTORY:  She is a 70 year old married white female, married for 43  years.  She has two children.  She lives with her husband.  She is retired  as a Scientist, physiological.  She has no legal problems.   FAMILY HISTORY:  None.   MENTAL STATUS EXAM:  She is very sleepy.  The patient was awakened for the  interview.  She was alert, in her night ware.  Speech is clear.  Mood is  anxious.  Affect is mildly anxious and flat.  Thought processes are  coherent.  There is no evidence of psychosis, no auditory and visual  hallucinations, no suicidal or homicidal ideation, no paranoia.  Cognitive:  Intact.  Memory is fair.  Judgment and insight are good.   ADMISSION DIAGNOSES:   AXIS I:  Major depression, recurrent.   AXIS II:  Deferred.   AXIS III:  1. Hypertension.  2. Fibromyalgia.  3. Headaches.   AXIS IV:  Deferred.   AXIS V:   Current is 25, this past year is 72.   INITIAL PLAN OF CARE:  Plan is a voluntary admission to [facility] for depression and decompensating mood.  Contract for safety, check  every 15 minutes.  Will obtain labs.  Will resume her medications.  Will  increase her Ativan for anxiety.  The patient is considered a fall risk.  The patient's instructions were reinforced to call for help.  To stabilize  her mood.  The patient is to follow up with Dr. Jennelle Cross.   ESTIMATED LENGTH OF STAY:  Three to four days.     Landry Corporal, N.P.                       [name], M.D.    JO/MEDQ  D:  06/27/2002  T:  06/27/2002  Job:  [id number]

## 2010-12-10 NOTE — Op Note (Signed)
[name], [name]                  ACCOUNT NO.:  [id number]   MEDICAL RECORD NO.:  [id number]          PATIENT TYPE:  INP   LOCATION:  0002                         FACILITY:  [facility]   PHYSICIAN:  Georges Lynch. Gioffre, M.D.DATE OF BIRTH:  [birth year]   DATE OF PROCEDURE:  07/21/2005  DATE OF DISCHARGE:                                 OPERATIVE REPORT   SURGEON:  Dr. Darrelyn Hillock   ASSISTANT:  Arlyn Leak, PA-C.   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   OPERATION:  Right total knee replacement utilizing the DePuy rotating  platform system.  All 3 components were cemented, and vancomycin was used in  the cement.  Note, she [place] and developed a staph infection after that but has been completely  clear over the years.  I thought this was a another good reason to use  vancomycin in the cement.   SIZES USED:  The femoral component was a size 3 posterior cruciate  sacrificing femoral component.  The tibial tray was a size 3.  The tibial  insert was a size 3, 12.5 mm thickness.  The patella was a size 32 mm with 3  pegs.   PROCEDURE:  Under general anesthesia, a routine orthopedic prep and draping  of the left lower extremity was carried out.  She had 1 gram of Ancef preop.  At this time, the leg was exsanguinated with an Esmarch; tourniquet was  elevated at 400 mmHg.  An incision then was made over the anterior aspect of  the right knee with the knee flexed.  Two flaps were created.  Self-  retaining retractors were inserted.  I then carried out a median  parapatellar incision reflecting the patella laterally, flexed the knee, and  excised the anterior posterior cruciate ligaments and the lateral medial  menisci.  Note, her knee was severely arthritic.  All the spurs from  patella, femur,  and tibia were removed.  We then made the initial drill hole  in the intercondylar notch; 12 mm thickness was taken off the distal femur.  Following this, we then inserted our #2 jig for a size 3 right femur, cut  our anterior, posterior, and chamfering cuts in the usual fashion.  We then  directed attention to the tibia, and we made the appropriate tibial cuts  utilizing intramedullary guide.  We removed 6 mm thickness off the affected  medial side.  We then cut our keel cut out of the tibia in the usual  fashion.  We then went back, cut our notch cut out of the intercondylar  notch of the femur.  We inserted our trial components, went through a range  of motion.  We first tried a 10 mm thickness tibial insert, and then we went  to a 12.5 mm thickness insert which was much more stable.  We then cut our  patella by utilizing a resurfacing procedure  for the patella.  We used a 32  mm patella.  Three drill holes were made in the articular surface of the  patella.  We removed all components, thoroughly water picked the knee, dried  the knee out, and cemented all 3 components in simultaneously.  All loose  pieces of cement were removed.  We then took the knee through motion.  We  first tried a 10.5 mm thickness insert and then went to the 12.5 mm  thickness insert which we felt was more stable.  We then removed the trial  insert, water picked the knee out again, made sure there were no loose  pieces of cement.  We then inserted our permanent 12.5 mm thickness size 3  tibial insert rotating platform  type, reduced the knee, took the knee through motion and had good motion and  good stability.  I then went on and inserted our Hemovac drain and closed  the wound in layers in usual fashion.  Skin was closed with metal staples.  Sterile Neosporin dressing was applied.           ______________________________  Georges Lynch Darrelyn Hillock, M.D.     RAG/MEDQ  D:  07/21/2005  T:  07/21/2005  Job:  [id number]

## 2010-12-10 NOTE — Discharge Summary (Signed)
[name], [name]                            ACCOUNT NO.:  [id number]   MEDICAL RECORD NO.:  [id number]                   PATIENT TYPE:  IPS   LOCATION:  0501                                 FACILITY:  [facility]   PHYSICIAN:  Jeanice Lim, M.D.              DATE OF BIRTH:  [birth year]   DATE OF ADMISSION:  06/26/2002  DATE OF DISCHARGE:  07/01/2002                                 DISCHARGE SUMMARY   IDENTIFYING DATA:  This is a 70 year old married Caucasian  female  voluntarily admitted on June 26, 2002, with a history of depression and  anxiety. Her referring psychiatrist had recently made a medication change,  tapering her off Serzone since she was worsening as far as  depressive  symptoms and starting Remeron, and she reported feeling increasingly  depressed and withdrawn, unable to sleep, unable to eat, shaking for the  past four days, feeling groggy, confused and unable to take care of her  ADLs. The patient was brought up by Dr. Meredith Staggers and this was the first  admission to the [facility].   She has no history of suicide attempts. The patient denied active suicidal  ideation at the time of admission, but she was fearful of being able to  function safely outside of the hospital at the time of admission.   MEDICATIONS:  1. Inderal 20 mg t.i.d.  2. Remeron 30 mg q.h.s. which the patient started one week ago.  3. Ativan b.i.d.   ALLERGIES:  1. ZOLOFT, THE PATIENT REPORTS THIS MADE HER CRAZY.  2. SULFA.   PHYSICAL EXAMINATION:  Essentially within normal limits. Neurologically  nonfocal.   LABORATORY DATA:  Routine admission labs essentially within normal limits,  including CBC and CMET except for elevated glucose at 128 and TSH was 2.84.   MENTAL STATUS EXAM:  Somewhat sleepy but arousable, alert female. Speech was  clear. Mood was anxious. Affect mildly anxious and depressed. Thought  process is goal directed. Thought content negative for clear  psychotic  symptoms. There was no acute suicidal or homicidal ideation. The patient was  preoccupied with fear that she was getting worse and that she was in a  fog. Her memory was fair. The patient reported impaired short-term memory.  Judgment and insight were fair.   ADMISSION DIAGNOSES:   AXIS I:  Major depression, recurrent, severe.   AXIS II:  None.   AXIS III:  Hypertension, fibromyalgia and  headaches.   AXIS IV:  Moderate. Multiple psychosocial stressors.   AXIS V:  25/70.   HOSPITAL COURSE:  The patient was admitted and we ordered routine  p.r.n.  medications. She underwent further monitoring. She was encouraged to  participate in individual, group and milieu therapy. She was resumed on  psychotropics and routine admission labs for ruling a medical etiology were  done. The patient was  restarted on  Inderal, Ativan, Remeron  and Ambien  p.r.n.   The patient was complaining of confusion and blurred vision as well as  dizziness. She was placed on fall risk initially. Ativan was optimized. The  patient admitted to this being prescribed and taking this three times a day.  Remeron was also optimized, targeting depressive symptoms. The patient was  clearly having  difficulty adjusting to medication changes and possibly  having  side effects from one medicine getting out of  her system and her  adjusting to a new medicine.   Hydrochlorothiazide was restarted and Seroquel was ordered p.r.n.  insomnia  to restore sleep. BuSpar was then added to augment antidepressant and to  target the complaint of anxiety. Dr. Lourdes Sledge on the weekend started  Restoril to assist with sleep.   Due to the lack of acute risk issues and observed ability to be able to take  care of ADLs and function safely on the unit with her other appropriate  behavior despite feeling dysphoric, was agreeable to intensive outpatient  treatment. Her family, specifically her husband, as well felt this also   would be in her best interests for intensive close follow up during  medication changes and in light of  her not having  clear clinical  indications for requiring inpatient treatment.   She was discharged in somewhat improved condition. Her mood was less  dysphoric and anxious. Her affect was a little brighter. Her thought process  was more goal directed. Her thought content negative for any dangerous  ideation or psychotic symptoms or suicidal or homicidal ideation. The  patient still felt depressed but felt that her thinking was more clear and  she was motivated to be compliant with aftercare plan.   DISCHARGE MEDICATIONS:  1. Inderal 20 mg t.i.d.  2. Remeron 45 mg q.h.s.  3. BuSpar 10 mg t.i.d.  4. Seroquel 100 mg  q.h.s.  5. Ativan 0.5 t.i.d.  6. Restoril 15 mg q.h.s. p.r.n.  insomnia.   FOLLOW UP:  The patient was to follow up with the intensive outpatient  program on July 02, 2002, at 9 a.m. for continued close monitoring of  medication changes and adjustment to medication changes treating  ongoing  depression.   DISCHARGE DIAGNOSES:   AXIS I:  Major depression, recurrent, severe.   AXIS II:  None.   AXIS III:  Hypertension, fibromyalgia and  headaches.    AXIS IV:  Moderate. Multiple psychosocial stressors.   AXIS V:  50 to 55.                                                 Jeanice Lim, M.D.    JEM/MEDQ  D:  07/02/2002  T:  07/02/2002  Job:  [id number]

## 2011-04-18 LAB — APTT: aPTT: 33

## 2011-04-18 LAB — URINALYSIS, ROUTINE W REFLEX MICROSCOPIC
Bilirubin Urine: NEGATIVE
Ketones, ur: NEGATIVE
Nitrite: NEGATIVE
Urobilinogen, UA: 0.2

## 2011-04-18 LAB — COMPREHENSIVE METABOLIC PANEL
Alkaline Phosphatase: 129 — ABNORMAL HIGH
BUN: 15
Chloride: 102
GFR calc non Af Amer: 49 — ABNORMAL LOW
Glucose, Bld: 94
Potassium: 4
Total Bilirubin: 0.5

## 2011-04-18 LAB — URINE CULTURE: Colony Count: 65000

## 2011-04-18 LAB — DIFFERENTIAL
Basophils Absolute: 0
Basophils Relative: 1
Neutro Abs: 4.6
Neutrophils Relative %: 58

## 2011-04-18 LAB — CBC
HCT: 40.5
Hemoglobin: 13.7
RDW: 14.9
WBC: 7.9

## 2011-04-18 LAB — TYPE AND SCREEN: Antibody Screen: NEGATIVE

## 2011-04-18 LAB — ABO/RH: ABO/RH(D): A NEG

## 2011-04-18 LAB — PROTIME-INR: INR: 0.9

## 2011-04-18 LAB — CROSSMATCH
ABO/RH(D): A NEG
Antibody Screen: NEGATIVE

## 2011-04-19 LAB — BASIC METABOLIC PANEL
BUN: 9
CO2: 29
CO2: 31
CO2: 34 — ABNORMAL HIGH
CO2: 36 — ABNORMAL HIGH
Calcium: 8.2 — ABNORMAL LOW
Calcium: 8.5
Calcium: 8.6
Calcium: 8.7
Calcium: 8.8
Calcium: 8.9
Calcium: 8.9
Chloride: 100
Chloride: 100
Chloride: 95 — ABNORMAL LOW
Chloride: 96
Chloride: 97
Chloride: 98
Creatinine, Ser: 0.89
Creatinine, Ser: 0.95
Creatinine, Ser: 0.98
Creatinine, Ser: 1.04
Creatinine, Ser: 1.06
GFR calc Af Amer: >60
GFR calc Af Amer: >60
GFR calc Af Amer: >60
GFR calc Af Amer: >60
GFR calc Af Amer: >60
GFR calc Af Amer: >60
GFR calc Af Amer: >60
GFR calc Af Amer: >60
GFR calc non Af Amer: 51 — ABNORMAL LOW
GFR calc non Af Amer: 58 — ABNORMAL LOW
GFR calc non Af Amer: 59 — ABNORMAL LOW
Potassium: 3.7
Potassium: 3.8
Sodium: 134 — ABNORMAL LOW
Sodium: 139
Sodium: 139
Sodium: 139

## 2011-04-19 LAB — CARDIAC PANEL(CRET KIN+CKTOT+MB+TROPI)
CK, MB: 1.7
Total CK: 140
Troponin I: 0.01

## 2011-04-19 LAB — CBC
HCT: 29.4 — ABNORMAL LOW
HCT: 29.9 — ABNORMAL LOW
Hemoglobin: 10.2 — ABNORMAL LOW
Hemoglobin: 9.9 — ABNORMAL LOW
MCHC: 33.6
MCHC: 34.1
MCV: 89.7
MCV: 89.7
MCV: 90.4
Platelets: 215
Platelets: 263
RBC: 3.05 — ABNORMAL LOW
RBC: 3.15 — ABNORMAL LOW
RBC: 3.18 — ABNORMAL LOW
RBC: 3.25 — ABNORMAL LOW
RBC: 3.31 — ABNORMAL LOW
RBC: 3.71 — ABNORMAL LOW
RDW: 15.1
WBC: 10.1
WBC: 13.6 — ABNORMAL HIGH
WBC: 7.4
WBC: 7.6

## 2011-04-19 LAB — BLOOD GAS, ARTERIAL
Bicarbonate: 33.1 — ABNORMAL HIGH
O2 Saturation: 79.3
Patient temperature: 98.6
TCO2: 35
pO2, Arterial: 42.6 — ABNORMAL LOW

## 2011-04-19 LAB — B-NATRIURETIC PEPTIDE (CONVERTED LAB): Pro B Natriuretic peptide (BNP): 51

## 2013-12-02 DIAGNOSIS — I2692 Saddle embolus of pulmonary artery without acute cor pulmonale: Secondary | ICD-10-CM | POA: Insufficient documentation

## 2014-09-13 DIAGNOSIS — Z7901 Long term (current) use of anticoagulants: Secondary | ICD-10-CM | POA: Insufficient documentation

## 2014-09-13 DIAGNOSIS — J449 Chronic obstructive pulmonary disease, unspecified: Secondary | ICD-10-CM | POA: Insufficient documentation

## 2014-09-13 DIAGNOSIS — J45909 Unspecified asthma, uncomplicated: Secondary | ICD-10-CM | POA: Insufficient documentation

## 2014-10-08 DIAGNOSIS — IMO0001 Reserved for inherently not codable concepts without codable children: Secondary | ICD-10-CM | POA: Insufficient documentation

## 2015-04-01 DIAGNOSIS — Z9181 History of falling: Secondary | ICD-10-CM | POA: Insufficient documentation

## 2015-04-01 DIAGNOSIS — M797 Fibromyalgia: Secondary | ICD-10-CM | POA: Insufficient documentation

## 2015-04-01 DIAGNOSIS — M810 Age-related osteoporosis without current pathological fracture: Secondary | ICD-10-CM | POA: Insufficient documentation

## 2015-07-29 DIAGNOSIS — R791 Abnormal coagulation profile: Secondary | ICD-10-CM | POA: Diagnosis not present

## 2015-07-31 DIAGNOSIS — J45901 Unspecified asthma with (acute) exacerbation: Secondary | ICD-10-CM | POA: Diagnosis not present

## 2015-07-31 DIAGNOSIS — R791 Abnormal coagulation profile: Secondary | ICD-10-CM | POA: Diagnosis not present

## 2015-08-07 DIAGNOSIS — Z7901 Long term (current) use of anticoagulants: Secondary | ICD-10-CM | POA: Diagnosis not present

## 2015-08-17 DIAGNOSIS — F332 Major depressive disorder, recurrent severe without psychotic features: Secondary | ICD-10-CM | POA: Diagnosis not present

## 2015-08-26 DIAGNOSIS — R791 Abnormal coagulation profile: Secondary | ICD-10-CM | POA: Diagnosis not present

## 2015-09-02 DIAGNOSIS — R32 Unspecified urinary incontinence: Secondary | ICD-10-CM | POA: Diagnosis not present

## 2015-09-02 DIAGNOSIS — N281 Cyst of kidney, acquired: Secondary | ICD-10-CM | POA: Diagnosis not present

## 2015-09-11 DIAGNOSIS — Z7901 Long term (current) use of anticoagulants: Secondary | ICD-10-CM | POA: Diagnosis not present

## 2015-09-14 DIAGNOSIS — Z85828 Personal history of other malignant neoplasm of skin: Secondary | ICD-10-CM | POA: Diagnosis not present

## 2015-09-14 DIAGNOSIS — L821 Other seborrheic keratosis: Secondary | ICD-10-CM | POA: Diagnosis not present

## 2015-09-14 DIAGNOSIS — Z08 Encounter for follow-up examination after completed treatment for malignant neoplasm: Secondary | ICD-10-CM | POA: Diagnosis not present

## 2015-09-14 DIAGNOSIS — C44311 Basal cell carcinoma of skin of nose: Secondary | ICD-10-CM | POA: Diagnosis not present

## 2015-09-14 DIAGNOSIS — L820 Inflamed seborrheic keratosis: Secondary | ICD-10-CM | POA: Diagnosis not present

## 2015-09-25 DIAGNOSIS — Z7901 Long term (current) use of anticoagulants: Secondary | ICD-10-CM | POA: Diagnosis not present

## 2015-10-01 DIAGNOSIS — E785 Hyperlipidemia, unspecified: Secondary | ICD-10-CM | POA: Diagnosis not present

## 2015-10-01 DIAGNOSIS — I10 Essential (primary) hypertension: Secondary | ICD-10-CM | POA: Diagnosis not present

## 2015-10-01 DIAGNOSIS — Z79899 Other long term (current) drug therapy: Secondary | ICD-10-CM | POA: Diagnosis not present

## 2015-10-01 DIAGNOSIS — Z0001 Encounter for general adult medical examination with abnormal findings: Secondary | ICD-10-CM | POA: Diagnosis not present

## 2015-10-01 DIAGNOSIS — J40 Bronchitis, not specified as acute or chronic: Secondary | ICD-10-CM | POA: Diagnosis not present

## 2015-10-01 DIAGNOSIS — R7301 Impaired fasting glucose: Secondary | ICD-10-CM | POA: Diagnosis not present

## 2015-10-12 DIAGNOSIS — L820 Inflamed seborrheic keratosis: Secondary | ICD-10-CM | POA: Diagnosis not present

## 2015-10-12 DIAGNOSIS — C44311 Basal cell carcinoma of skin of nose: Secondary | ICD-10-CM | POA: Diagnosis not present

## 2015-10-27 DIAGNOSIS — R791 Abnormal coagulation profile: Secondary | ICD-10-CM | POA: Diagnosis not present

## 2015-11-03 DIAGNOSIS — Z7901 Long term (current) use of anticoagulants: Secondary | ICD-10-CM | POA: Diagnosis not present

## 2015-11-17 DIAGNOSIS — Z7901 Long term (current) use of anticoagulants: Secondary | ICD-10-CM | POA: Diagnosis not present

## 2015-12-04 DIAGNOSIS — J40 Bronchitis, not specified as acute or chronic: Secondary | ICD-10-CM | POA: Diagnosis not present

## 2015-12-04 DIAGNOSIS — J45901 Unspecified asthma with (acute) exacerbation: Secondary | ICD-10-CM | POA: Diagnosis not present

## 2015-12-09 DIAGNOSIS — Z87891 Personal history of nicotine dependence: Secondary | ICD-10-CM | POA: Diagnosis not present

## 2015-12-09 DIAGNOSIS — S72491D Other fracture of lower end of right femur, subsequent encounter for closed fracture with routine healing: Secondary | ICD-10-CM | POA: Diagnosis not present

## 2015-12-09 DIAGNOSIS — Z7901 Long term (current) use of anticoagulants: Secondary | ICD-10-CM | POA: Diagnosis not present

## 2015-12-09 DIAGNOSIS — Z8781 Personal history of (healed) traumatic fracture: Secondary | ICD-10-CM | POA: Diagnosis not present

## 2015-12-09 DIAGNOSIS — Z4789 Encounter for other orthopedic aftercare: Secondary | ICD-10-CM | POA: Diagnosis not present

## 2015-12-09 DIAGNOSIS — W19XXXD Unspecified fall, subsequent encounter: Secondary | ICD-10-CM | POA: Diagnosis not present

## 2015-12-09 DIAGNOSIS — S52591S Other fractures of lower end of right radius, sequela: Secondary | ICD-10-CM | POA: Diagnosis not present

## 2015-12-09 DIAGNOSIS — Z96651 Presence of right artificial knee joint: Secondary | ICD-10-CM | POA: Diagnosis not present

## 2015-12-18 DIAGNOSIS — Z7901 Long term (current) use of anticoagulants: Secondary | ICD-10-CM | POA: Diagnosis not present

## 2015-12-28 DIAGNOSIS — Z7901 Long term (current) use of anticoagulants: Secondary | ICD-10-CM | POA: Diagnosis not present

## 2015-12-30 DIAGNOSIS — Z9181 History of falling: Secondary | ICD-10-CM | POA: Diagnosis not present

## 2015-12-30 DIAGNOSIS — F419 Anxiety disorder, unspecified: Secondary | ICD-10-CM | POA: Diagnosis not present

## 2015-12-30 DIAGNOSIS — Z8781 Personal history of (healed) traumatic fracture: Secondary | ICD-10-CM | POA: Diagnosis not present

## 2015-12-30 DIAGNOSIS — J449 Chronic obstructive pulmonary disease, unspecified: Secondary | ICD-10-CM | POA: Diagnosis not present

## 2015-12-30 DIAGNOSIS — M797 Fibromyalgia: Secondary | ICD-10-CM | POA: Diagnosis not present

## 2015-12-30 DIAGNOSIS — M810 Age-related osteoporosis without current pathological fracture: Secondary | ICD-10-CM | POA: Diagnosis not present

## 2016-01-06 DIAGNOSIS — M6281 Muscle weakness (generalized): Secondary | ICD-10-CM | POA: Diagnosis not present

## 2016-01-06 DIAGNOSIS — M810 Age-related osteoporosis without current pathological fracture: Secondary | ICD-10-CM | POA: Diagnosis not present

## 2016-01-06 DIAGNOSIS — R2681 Unsteadiness on feet: Secondary | ICD-10-CM | POA: Diagnosis not present

## 2016-01-06 DIAGNOSIS — Z9181 History of falling: Secondary | ICD-10-CM | POA: Diagnosis not present

## 2016-02-02 DIAGNOSIS — K219 Gastro-esophageal reflux disease without esophagitis: Secondary | ICD-10-CM | POA: Diagnosis not present

## 2016-02-02 DIAGNOSIS — I10 Essential (primary) hypertension: Secondary | ICD-10-CM | POA: Diagnosis not present

## 2016-02-02 DIAGNOSIS — Z7901 Long term (current) use of anticoagulants: Secondary | ICD-10-CM | POA: Diagnosis not present

## 2016-02-02 DIAGNOSIS — J45909 Unspecified asthma, uncomplicated: Secondary | ICD-10-CM | POA: Diagnosis not present

## 2016-02-02 DIAGNOSIS — Z79899 Other long term (current) drug therapy: Secondary | ICD-10-CM | POA: Diagnosis not present

## 2016-02-02 DIAGNOSIS — E785 Hyperlipidemia, unspecified: Secondary | ICD-10-CM | POA: Diagnosis not present

## 2016-02-02 DIAGNOSIS — R7301 Impaired fasting glucose: Secondary | ICD-10-CM | POA: Diagnosis not present

## 2016-02-09 DIAGNOSIS — F332 Major depressive disorder, recurrent severe without psychotic features: Secondary | ICD-10-CM | POA: Diagnosis not present

## 2016-02-12 DIAGNOSIS — D485 Neoplasm of uncertain behavior of skin: Secondary | ICD-10-CM | POA: Diagnosis not present

## 2016-02-12 DIAGNOSIS — Z08 Encounter for follow-up examination after completed treatment for malignant neoplasm: Secondary | ICD-10-CM | POA: Diagnosis not present

## 2016-02-12 DIAGNOSIS — Z85828 Personal history of other malignant neoplasm of skin: Secondary | ICD-10-CM | POA: Diagnosis not present

## 2016-02-12 DIAGNOSIS — D224 Melanocytic nevi of scalp and neck: Secondary | ICD-10-CM | POA: Diagnosis not present

## 2016-02-16 DIAGNOSIS — R32 Unspecified urinary incontinence: Secondary | ICD-10-CM | POA: Diagnosis not present

## 2016-02-16 DIAGNOSIS — Z9181 History of falling: Secondary | ICD-10-CM | POA: Diagnosis not present

## 2016-02-16 DIAGNOSIS — M810 Age-related osteoporosis without current pathological fracture: Secondary | ICD-10-CM | POA: Diagnosis not present

## 2016-02-16 DIAGNOSIS — R2681 Unsteadiness on feet: Secondary | ICD-10-CM | POA: Diagnosis not present

## 2016-02-16 DIAGNOSIS — R351 Nocturia: Secondary | ICD-10-CM | POA: Diagnosis not present

## 2016-02-16 DIAGNOSIS — M6281 Muscle weakness (generalized): Secondary | ICD-10-CM | POA: Diagnosis not present

## 2016-02-23 DIAGNOSIS — Z9181 History of falling: Secondary | ICD-10-CM | POA: Diagnosis not present

## 2016-02-23 DIAGNOSIS — M6281 Muscle weakness (generalized): Secondary | ICD-10-CM | POA: Diagnosis not present

## 2016-02-23 DIAGNOSIS — M810 Age-related osteoporosis without current pathological fracture: Secondary | ICD-10-CM | POA: Diagnosis not present

## 2016-02-23 DIAGNOSIS — R2681 Unsteadiness on feet: Secondary | ICD-10-CM | POA: Diagnosis not present

## 2016-03-04 DIAGNOSIS — Z7901 Long term (current) use of anticoagulants: Secondary | ICD-10-CM | POA: Diagnosis not present

## 2016-03-22 DIAGNOSIS — F332 Major depressive disorder, recurrent severe without psychotic features: Secondary | ICD-10-CM | POA: Diagnosis not present

## 2016-04-01 DIAGNOSIS — M810 Age-related osteoporosis without current pathological fracture: Secondary | ICD-10-CM | POA: Diagnosis not present

## 2016-04-01 DIAGNOSIS — M6281 Muscle weakness (generalized): Secondary | ICD-10-CM | POA: Diagnosis not present

## 2016-04-01 DIAGNOSIS — R2681 Unsteadiness on feet: Secondary | ICD-10-CM | POA: Diagnosis not present

## 2016-04-01 DIAGNOSIS — Z9181 History of falling: Secondary | ICD-10-CM | POA: Diagnosis not present

## 2016-04-05 DIAGNOSIS — R791 Abnormal coagulation profile: Secondary | ICD-10-CM | POA: Diagnosis not present

## 2016-04-12 DIAGNOSIS — Z7901 Long term (current) use of anticoagulants: Secondary | ICD-10-CM | POA: Diagnosis not present

## 2016-04-21 DIAGNOSIS — Z7901 Long term (current) use of anticoagulants: Secondary | ICD-10-CM | POA: Diagnosis not present

## 2016-04-21 DIAGNOSIS — J45901 Unspecified asthma with (acute) exacerbation: Secondary | ICD-10-CM | POA: Diagnosis not present

## 2016-04-21 DIAGNOSIS — M6281 Muscle weakness (generalized): Secondary | ICD-10-CM | POA: Diagnosis not present

## 2016-04-21 DIAGNOSIS — R5383 Other fatigue: Secondary | ICD-10-CM | POA: Diagnosis not present

## 2016-04-21 DIAGNOSIS — R51 Headache: Secondary | ICD-10-CM | POA: Diagnosis not present

## 2016-05-02 DIAGNOSIS — Z7901 Long term (current) use of anticoagulants: Secondary | ICD-10-CM | POA: Diagnosis not present

## 2016-05-05 DIAGNOSIS — R296 Repeated falls: Secondary | ICD-10-CM | POA: Diagnosis not present

## 2016-05-05 DIAGNOSIS — R51 Headache: Secondary | ICD-10-CM | POA: Diagnosis not present

## 2016-06-07 DIAGNOSIS — Z7901 Long term (current) use of anticoagulants: Secondary | ICD-10-CM | POA: Diagnosis not present

## 2016-06-07 DIAGNOSIS — E559 Vitamin D deficiency, unspecified: Secondary | ICD-10-CM | POA: Diagnosis not present

## 2016-06-27 DIAGNOSIS — Z7901 Long term (current) use of anticoagulants: Secondary | ICD-10-CM | POA: Diagnosis not present

## 2016-07-12 DIAGNOSIS — J45901 Unspecified asthma with (acute) exacerbation: Secondary | ICD-10-CM | POA: Diagnosis not present

## 2016-07-12 DIAGNOSIS — J40 Bronchitis, not specified as acute or chronic: Secondary | ICD-10-CM | POA: Diagnosis not present

## 2016-07-29 DIAGNOSIS — R791 Abnormal coagulation profile: Secondary | ICD-10-CM | POA: Diagnosis not present

## 2016-08-01 DIAGNOSIS — J45909 Unspecified asthma, uncomplicated: Secondary | ICD-10-CM | POA: Diagnosis not present

## 2016-08-01 DIAGNOSIS — J019 Acute sinusitis, unspecified: Secondary | ICD-10-CM | POA: Diagnosis not present

## 2016-08-05 DIAGNOSIS — R791 Abnormal coagulation profile: Secondary | ICD-10-CM | POA: Diagnosis not present

## 2016-08-15 DIAGNOSIS — L304 Erythema intertrigo: Secondary | ICD-10-CM | POA: Diagnosis not present

## 2016-08-15 DIAGNOSIS — L570 Actinic keratosis: Secondary | ICD-10-CM | POA: Diagnosis not present

## 2016-08-17 DIAGNOSIS — R791 Abnormal coagulation profile: Secondary | ICD-10-CM | POA: Diagnosis not present

## 2016-08-22 DIAGNOSIS — J40 Bronchitis, not specified as acute or chronic: Secondary | ICD-10-CM | POA: Diagnosis not present

## 2016-08-22 DIAGNOSIS — R791 Abnormal coagulation profile: Secondary | ICD-10-CM | POA: Diagnosis not present

## 2016-08-22 DIAGNOSIS — R05 Cough: Secondary | ICD-10-CM | POA: Diagnosis not present

## 2016-08-29 DIAGNOSIS — R05 Cough: Secondary | ICD-10-CM | POA: Diagnosis not present

## 2016-08-31 DIAGNOSIS — R791 Abnormal coagulation profile: Secondary | ICD-10-CM | POA: Diagnosis not present

## 2016-09-13 DIAGNOSIS — R791 Abnormal coagulation profile: Secondary | ICD-10-CM | POA: Diagnosis not present

## 2016-09-28 DIAGNOSIS — Z7901 Long term (current) use of anticoagulants: Secondary | ICD-10-CM | POA: Diagnosis not present

## 2016-10-05 DIAGNOSIS — Z79899 Other long term (current) drug therapy: Secondary | ICD-10-CM | POA: Diagnosis not present

## 2016-10-05 DIAGNOSIS — E785 Hyperlipidemia, unspecified: Secondary | ICD-10-CM | POA: Diagnosis not present

## 2016-10-05 DIAGNOSIS — E559 Vitamin D deficiency, unspecified: Secondary | ICD-10-CM | POA: Diagnosis not present

## 2016-10-17 DIAGNOSIS — R791 Abnormal coagulation profile: Secondary | ICD-10-CM | POA: Diagnosis not present

## 2016-10-20 DIAGNOSIS — R791 Abnormal coagulation profile: Secondary | ICD-10-CM | POA: Diagnosis not present

## 2016-10-27 DIAGNOSIS — Z7901 Long term (current) use of anticoagulants: Secondary | ICD-10-CM | POA: Diagnosis not present

## 2016-11-15 DIAGNOSIS — Z7901 Long term (current) use of anticoagulants: Secondary | ICD-10-CM | POA: Diagnosis not present

## 2016-11-15 DIAGNOSIS — J45901 Unspecified asthma with (acute) exacerbation: Secondary | ICD-10-CM | POA: Diagnosis not present

## 2016-11-15 DIAGNOSIS — J40 Bronchitis, not specified as acute or chronic: Secondary | ICD-10-CM | POA: Diagnosis not present

## 2016-11-23 DIAGNOSIS — H01002 Unspecified blepharitis right lower eyelid: Secondary | ICD-10-CM | POA: Diagnosis not present

## 2016-11-23 DIAGNOSIS — H01004 Unspecified blepharitis left upper eyelid: Secondary | ICD-10-CM | POA: Diagnosis not present

## 2016-11-23 DIAGNOSIS — H01005 Unspecified blepharitis left lower eyelid: Secondary | ICD-10-CM | POA: Diagnosis not present

## 2016-11-23 DIAGNOSIS — H01001 Unspecified blepharitis right upper eyelid: Secondary | ICD-10-CM | POA: Diagnosis not present

## 2016-12-15 DIAGNOSIS — Z7901 Long term (current) use of anticoagulants: Secondary | ICD-10-CM | POA: Diagnosis not present

## 2016-12-23 DIAGNOSIS — H04123 Dry eye syndrome of bilateral lacrimal glands: Secondary | ICD-10-CM | POA: Diagnosis not present

## 2016-12-28 DIAGNOSIS — Z8781 Personal history of (healed) traumatic fracture: Secondary | ICD-10-CM | POA: Diagnosis not present

## 2016-12-28 DIAGNOSIS — M797 Fibromyalgia: Secondary | ICD-10-CM | POA: Diagnosis not present

## 2016-12-28 DIAGNOSIS — J449 Chronic obstructive pulmonary disease, unspecified: Secondary | ICD-10-CM | POA: Diagnosis not present

## 2016-12-28 DIAGNOSIS — M810 Age-related osteoporosis without current pathological fracture: Secondary | ICD-10-CM | POA: Diagnosis not present

## 2016-12-28 DIAGNOSIS — Z9181 History of falling: Secondary | ICD-10-CM | POA: Diagnosis not present

## 2017-01-12 DIAGNOSIS — M810 Age-related osteoporosis without current pathological fracture: Secondary | ICD-10-CM | POA: Diagnosis not present

## 2017-01-16 DIAGNOSIS — Z7901 Long term (current) use of anticoagulants: Secondary | ICD-10-CM | POA: Diagnosis not present

## 2017-02-15 DIAGNOSIS — Z7901 Long term (current) use of anticoagulants: Secondary | ICD-10-CM | POA: Diagnosis not present

## 2017-02-16 DIAGNOSIS — Z9181 History of falling: Secondary | ICD-10-CM | POA: Diagnosis not present

## 2017-02-16 DIAGNOSIS — M791 Myalgia: Secondary | ICD-10-CM | POA: Diagnosis not present

## 2017-02-16 DIAGNOSIS — M810 Age-related osteoporosis without current pathological fracture: Secondary | ICD-10-CM | POA: Diagnosis not present

## 2017-02-16 DIAGNOSIS — Z8781 Personal history of (healed) traumatic fracture: Secondary | ICD-10-CM | POA: Diagnosis not present

## 2017-02-22 DIAGNOSIS — L820 Inflamed seborrheic keratosis: Secondary | ICD-10-CM | POA: Diagnosis not present

## 2017-02-22 DIAGNOSIS — Z85828 Personal history of other malignant neoplasm of skin: Secondary | ICD-10-CM | POA: Diagnosis not present

## 2017-02-22 DIAGNOSIS — Z08 Encounter for follow-up examination after completed treatment for malignant neoplasm: Secondary | ICD-10-CM | POA: Diagnosis not present

## 2017-02-22 DIAGNOSIS — L570 Actinic keratosis: Secondary | ICD-10-CM | POA: Diagnosis not present

## 2017-03-06 DIAGNOSIS — F332 Major depressive disorder, recurrent severe without psychotic features: Secondary | ICD-10-CM | POA: Diagnosis not present

## 2017-03-20 DIAGNOSIS — H01001 Unspecified blepharitis right upper eyelid: Secondary | ICD-10-CM | POA: Diagnosis not present

## 2017-03-20 DIAGNOSIS — H01002 Unspecified blepharitis right lower eyelid: Secondary | ICD-10-CM | POA: Diagnosis not present

## 2017-03-20 DIAGNOSIS — H01005 Unspecified blepharitis left lower eyelid: Secondary | ICD-10-CM | POA: Diagnosis not present

## 2017-03-20 DIAGNOSIS — H01004 Unspecified blepharitis left upper eyelid: Secondary | ICD-10-CM | POA: Diagnosis not present

## 2017-03-23 DIAGNOSIS — Z7901 Long term (current) use of anticoagulants: Secondary | ICD-10-CM | POA: Diagnosis not present

## 2017-04-24 DIAGNOSIS — M79671 Pain in right foot: Secondary | ICD-10-CM | POA: Diagnosis not present

## 2017-04-24 DIAGNOSIS — J40 Bronchitis, not specified as acute or chronic: Secondary | ICD-10-CM | POA: Diagnosis not present

## 2017-04-24 DIAGNOSIS — M79672 Pain in left foot: Secondary | ICD-10-CM | POA: Diagnosis not present

## 2017-04-24 DIAGNOSIS — M797 Fibromyalgia: Secondary | ICD-10-CM | POA: Diagnosis not present

## 2017-04-28 ENCOUNTER — Ambulatory Visit: Payer: Self-pay | Admitting: Sports Medicine

## 2017-05-08 DIAGNOSIS — R351 Nocturia: Secondary | ICD-10-CM | POA: Diagnosis not present

## 2017-05-08 DIAGNOSIS — Z7901 Long term (current) use of anticoagulants: Secondary | ICD-10-CM | POA: Diagnosis not present

## 2017-05-08 DIAGNOSIS — R32 Unspecified urinary incontinence: Secondary | ICD-10-CM | POA: Diagnosis not present

## 2017-05-11 ENCOUNTER — Encounter: Payer: Self-pay | Admitting: Sports Medicine

## 2017-05-11 ENCOUNTER — Ambulatory Visit (INDEPENDENT_AMBULATORY_CARE_PROVIDER_SITE_OTHER): Payer: PPO | Admitting: Sports Medicine

## 2017-05-11 VITALS — BP 129/73 | HR 99 | Resp 16 | Ht 66.0 in | Wt 120.0 lb

## 2017-05-11 DIAGNOSIS — G629 Polyneuropathy, unspecified: Secondary | ICD-10-CM

## 2017-05-11 DIAGNOSIS — I739 Peripheral vascular disease, unspecified: Secondary | ICD-10-CM | POA: Diagnosis not present

## 2017-05-11 DIAGNOSIS — M79672 Pain in left foot: Secondary | ICD-10-CM

## 2017-05-11 DIAGNOSIS — M5410 Radiculopathy, site unspecified: Secondary | ICD-10-CM

## 2017-05-11 DIAGNOSIS — M79671 Pain in right foot: Secondary | ICD-10-CM | POA: Diagnosis not present

## 2017-05-11 NOTE — Progress Notes (Signed)
   Subjective:    Patient ID: [name], female    DOB: 02-Feb-1941, 76 y.o.   MRN: [id number]  HPI    Review of Systems  Constitutional: Positive for fatigue.  HENT: Positive for hearing loss.   Eyes: Positive for itching.  Respiratory: Positive for shortness of breath and wheezing.   Cardiovascular: Positive for leg swelling.  Gastrointestinal: Positive for abdominal pain and constipation.  Musculoskeletal: Positive for back pain, gait problem and myalgias.  Neurological: Positive for dizziness, weakness and light-headedness.  Hematological: Bruises/bleeds easily.  Psychiatric/Behavioral: Positive for behavioral problems.  All other systems reviewed and are negative.      Objective:   Physical Exam        Assessment & Plan:

## 2017-05-11 NOTE — Patient Instructions (Signed)
Aspercreme to feet as needed up to 4 times per day for pain  Vit B complex

## 2017-05-11 NOTE — Progress Notes (Signed)
Subjective: [name] is a 76 y.o. female patient with history of foot pain x 5 years presents to office states that both feet feel numb and tingling. 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 States that she has not tried anything for the pain. Currently uses a walker. Patient denies any other pedal complaints.  Admits worsening back pain   ROS per nurse note.  There are no active problems to display for this patient.  No current outpatient prescriptions on file prior to visit.   No current facility-administered medications on file prior to visit.    Allergies  Allergen Reactions  . Sulfa Antibiotics Rash    Pt. Stated," a rash."    No results found for this or any previous visit (from the past 2160 hour(s)).  Objective: General: Patient is awake, alert, and oriented x 3 and in no acute distress.  Integument: Skin is warm, dry and supple bilateral. Nails are short and well manicured, 1-5 bilateral. No signs of infection. No open lesions or preulcerative lesions present bilateral. Remaining integument unremarkable.  Vasculature:  Dorsalis Pedis pulse 1/4 bilateral. Posterior Tibial pulse  1/4 bilateral.  Capillary fill time <5 sec 1-5 bilateral. Scant hair growth to the level of the digits. Temperature gradient within normal limits. ++ varicosities present bilateral. No edema present bilateral.   Neurology: The patient has absent sensation measured with a 5.07/10g Semmes Weinstein Monofilament at all pedal sites bilateral. Vibratory sensation absent bilateral with tuning fork. No Babinski sign present bilateral.   Musculoskeletal: No symptomatic pedal deformities noted bilateral. Muscular strength 5/5 in all lower extremity muscular groups bilateral without pain on range of motion. Right leg shorter. No tenderness with calf compression bilateral.  Assessment and Plan: Problem List Items Addressed This Visit    None    Visit Diagnoses    Neuropathy    -   Primary   Radiculopathy, unspecified spinal region       Relevant Medications   QUEtiapine (SEROQUEL) 200 MG tablet   LORazepam (ATIVAN) 1 MG tablet   Foot pain, bilateral       PVD (peripheral vascular disease) (HCC)       Relevant Medications   warfarin (COUMADIN) 2 MG tablet   pravastatin (PRAVACHOL) 40 MG tablet   amLODipine (NORVASC) 5 MG tablet   irbesartan (AVAPRO) 150 MG tablet      -Examined patient. -Discussed treatment options for suspected neuropathy -Recommend Vit B complex and asperecreme for pain -Rx NCV/EMG  -Answered all patient questions -Patient to return after nerve testing  -Patient advised to call the office if any problems or questions arise in the meantime.  Landis Martins, DPM

## 2017-05-12 ENCOUNTER — Telehealth: Payer: Self-pay | Admitting: *Deleted

## 2017-05-12 DIAGNOSIS — M5410 Radiculopathy, site unspecified: Secondary | ICD-10-CM

## 2017-05-12 DIAGNOSIS — G629 Polyneuropathy, unspecified: Secondary | ICD-10-CM

## 2017-05-12 DIAGNOSIS — I739 Peripheral vascular disease, unspecified: Secondary | ICD-10-CM

## 2017-05-12 DIAGNOSIS — M79671 Pain in right foot: Secondary | ICD-10-CM

## 2017-05-12 DIAGNOSIS — M79672 Pain in left foot: Secondary | ICD-10-CM

## 2017-05-12 NOTE — Telephone Encounter (Addendum)
-----   Message from Landis Martins, [place] sent at 05/11/2017 10:54 AM EDT ----- Regarding: NCV/EMG Nerve studies  Numbness, tingling, pain in both feet in a stocking and glove distrubution  Absent protective sensation to toes Absent vibratory sensation to 1st MTPJs bilateral. 05/12/2017-Faxed orders and demographics to [facility].

## 2017-05-15 DIAGNOSIS — H0288B Meibomian gland dysfunction left eye, upper and lower eyelids: Secondary | ICD-10-CM | POA: Diagnosis not present

## 2017-05-15 DIAGNOSIS — H0288A Meibomian gland dysfunction right eye, upper and lower eyelids: Secondary | ICD-10-CM | POA: Diagnosis not present

## 2017-06-08 DIAGNOSIS — R791 Abnormal coagulation profile: Secondary | ICD-10-CM | POA: Diagnosis not present

## 2017-06-14 DIAGNOSIS — Z7901 Long term (current) use of anticoagulants: Secondary | ICD-10-CM | POA: Diagnosis not present

## 2017-06-14 DIAGNOSIS — R791 Abnormal coagulation profile: Secondary | ICD-10-CM | POA: Diagnosis not present

## 2017-06-14 DIAGNOSIS — E785 Hyperlipidemia, unspecified: Secondary | ICD-10-CM | POA: Diagnosis not present

## 2017-06-14 DIAGNOSIS — Z79899 Other long term (current) drug therapy: Secondary | ICD-10-CM | POA: Diagnosis not present

## 2017-06-14 DIAGNOSIS — I10 Essential (primary) hypertension: Secondary | ICD-10-CM | POA: Diagnosis not present

## 2017-06-19 DIAGNOSIS — N3281 Overactive bladder: Secondary | ICD-10-CM | POA: Diagnosis not present

## 2017-06-19 DIAGNOSIS — R32 Unspecified urinary incontinence: Secondary | ICD-10-CM | POA: Diagnosis not present

## 2017-06-20 DIAGNOSIS — G609 Hereditary and idiopathic neuropathy, unspecified: Secondary | ICD-10-CM | POA: Diagnosis not present

## 2017-06-20 DIAGNOSIS — R200 Anesthesia of skin: Secondary | ICD-10-CM | POA: Diagnosis not present

## 2017-06-20 DIAGNOSIS — R202 Paresthesia of skin: Secondary | ICD-10-CM | POA: Diagnosis not present

## 2017-06-26 DIAGNOSIS — R791 Abnormal coagulation profile: Secondary | ICD-10-CM | POA: Diagnosis not present

## 2017-06-27 ENCOUNTER — Other Ambulatory Visit: Payer: Self-pay

## 2017-06-27 DIAGNOSIS — G629 Polyneuropathy, unspecified: Secondary | ICD-10-CM

## 2017-06-27 DIAGNOSIS — M79672 Pain in left foot: Secondary | ICD-10-CM

## 2017-06-27 DIAGNOSIS — M79671 Pain in right foot: Secondary | ICD-10-CM

## 2017-06-27 NOTE — Progress Notes (Signed)
Neuro consult faxed to [facility] at:  [phone] Phone [phone] [address]  [place] [address]

## 2017-07-06 DIAGNOSIS — R791 Abnormal coagulation profile: Secondary | ICD-10-CM | POA: Diagnosis not present

## 2017-07-07 DIAGNOSIS — J40 Bronchitis, not specified as acute or chronic: Secondary | ICD-10-CM | POA: Diagnosis not present

## 2017-07-13 DIAGNOSIS — E559 Vitamin D deficiency, unspecified: Secondary | ICD-10-CM | POA: Diagnosis not present

## 2017-07-28 DIAGNOSIS — Z7901 Long term (current) use of anticoagulants: Secondary | ICD-10-CM | POA: Diagnosis not present

## 2017-07-28 DIAGNOSIS — R791 Abnormal coagulation profile: Secondary | ICD-10-CM | POA: Diagnosis not present

## 2017-08-07 DIAGNOSIS — N3281 Overactive bladder: Secondary | ICD-10-CM | POA: Diagnosis not present

## 2017-08-07 DIAGNOSIS — R32 Unspecified urinary incontinence: Secondary | ICD-10-CM | POA: Diagnosis not present

## 2017-08-21 DIAGNOSIS — F332 Major depressive disorder, recurrent severe without psychotic features: Secondary | ICD-10-CM | POA: Diagnosis not present

## 2017-08-28 DIAGNOSIS — L820 Inflamed seborrheic keratosis: Secondary | ICD-10-CM | POA: Diagnosis not present

## 2017-08-30 DIAGNOSIS — J180 Bronchopneumonia, unspecified organism: Secondary | ICD-10-CM | POA: Diagnosis not present

## 2017-08-30 DIAGNOSIS — J45901 Unspecified asthma with (acute) exacerbation: Secondary | ICD-10-CM | POA: Diagnosis not present

## 2017-08-30 DIAGNOSIS — Z7901 Long term (current) use of anticoagulants: Secondary | ICD-10-CM | POA: Diagnosis not present

## 2017-09-11 DIAGNOSIS — M810 Age-related osteoporosis without current pathological fracture: Secondary | ICD-10-CM | POA: Diagnosis not present

## 2017-09-11 DIAGNOSIS — Z5181 Encounter for therapeutic drug level monitoring: Secondary | ICD-10-CM | POA: Diagnosis not present

## 2017-09-21 DIAGNOSIS — M797 Fibromyalgia: Secondary | ICD-10-CM | POA: Diagnosis not present

## 2017-09-21 DIAGNOSIS — J4520 Mild intermittent asthma, uncomplicated: Secondary | ICD-10-CM | POA: Diagnosis not present

## 2017-09-21 DIAGNOSIS — E785 Hyperlipidemia, unspecified: Secondary | ICD-10-CM | POA: Diagnosis not present

## 2017-09-21 DIAGNOSIS — F5101 Primary insomnia: Secondary | ICD-10-CM | POA: Diagnosis not present

## 2017-09-21 DIAGNOSIS — F325 Major depressive disorder, single episode, in full remission: Secondary | ICD-10-CM | POA: Diagnosis not present

## 2017-09-21 DIAGNOSIS — I10 Essential (primary) hypertension: Secondary | ICD-10-CM | POA: Diagnosis not present

## 2017-09-27 DIAGNOSIS — R351 Nocturia: Secondary | ICD-10-CM | POA: Diagnosis not present

## 2017-09-27 DIAGNOSIS — N3281 Overactive bladder: Secondary | ICD-10-CM | POA: Diagnosis not present

## 2017-10-19 DIAGNOSIS — I10 Essential (primary) hypertension: Secondary | ICD-10-CM | POA: Diagnosis not present

## 2017-10-19 DIAGNOSIS — H938X1 Other specified disorders of right ear: Secondary | ICD-10-CM | POA: Diagnosis not present

## 2017-10-19 DIAGNOSIS — E785 Hyperlipidemia, unspecified: Secondary | ICD-10-CM | POA: Diagnosis not present

## 2017-10-19 DIAGNOSIS — E559 Vitamin D deficiency, unspecified: Secondary | ICD-10-CM | POA: Diagnosis not present

## 2017-10-19 DIAGNOSIS — Z7901 Long term (current) use of anticoagulants: Secondary | ICD-10-CM | POA: Diagnosis not present

## 2017-10-19 DIAGNOSIS — T148XXA Other injury of unspecified body region, initial encounter: Secondary | ICD-10-CM | POA: Diagnosis not present

## 2017-10-19 DIAGNOSIS — Z79899 Other long term (current) drug therapy: Secondary | ICD-10-CM | POA: Diagnosis not present

## 2017-10-19 DIAGNOSIS — R7301 Impaired fasting glucose: Secondary | ICD-10-CM | POA: Diagnosis not present

## 2017-10-19 DIAGNOSIS — Z86711 Personal history of pulmonary embolism: Secondary | ICD-10-CM | POA: Diagnosis not present

## 2017-10-19 DIAGNOSIS — M6281 Muscle weakness (generalized): Secondary | ICD-10-CM | POA: Diagnosis not present

## 2017-11-28 DIAGNOSIS — H9190 Unspecified hearing loss, unspecified ear: Secondary | ICD-10-CM | POA: Diagnosis not present

## 2017-11-28 DIAGNOSIS — J342 Deviated nasal septum: Secondary | ICD-10-CM | POA: Diagnosis not present

## 2017-11-28 DIAGNOSIS — Q181 Preauricular sinus and cyst: Secondary | ICD-10-CM | POA: Diagnosis not present

## 2017-12-12 DIAGNOSIS — Q181 Preauricular sinus and cyst: Secondary | ICD-10-CM | POA: Diagnosis not present

## 2018-01-02 DIAGNOSIS — R351 Nocturia: Secondary | ICD-10-CM | POA: Diagnosis not present

## 2018-01-02 DIAGNOSIS — N3281 Overactive bladder: Secondary | ICD-10-CM | POA: Diagnosis not present

## 2018-01-15 DIAGNOSIS — H0288A Meibomian gland dysfunction right eye, upper and lower eyelids: Secondary | ICD-10-CM | POA: Diagnosis not present

## 2018-01-15 DIAGNOSIS — H0288B Meibomian gland dysfunction left eye, upper and lower eyelids: Secondary | ICD-10-CM | POA: Diagnosis not present

## 2018-01-18 DIAGNOSIS — R062 Wheezing: Secondary | ICD-10-CM | POA: Diagnosis not present

## 2018-01-18 DIAGNOSIS — J40 Bronchitis, not specified as acute or chronic: Secondary | ICD-10-CM | POA: Diagnosis not present

## 2018-01-18 DIAGNOSIS — R05 Cough: Secondary | ICD-10-CM | POA: Diagnosis not present

## 2018-02-12 DIAGNOSIS — Q181 Preauricular sinus and cyst: Secondary | ICD-10-CM | POA: Diagnosis not present

## 2018-02-12 DIAGNOSIS — H9190 Unspecified hearing loss, unspecified ear: Secondary | ICD-10-CM | POA: Diagnosis not present

## 2018-02-22 DIAGNOSIS — F332 Major depressive disorder, recurrent severe without psychotic features: Secondary | ICD-10-CM | POA: Diagnosis not present

## 2018-05-24 DIAGNOSIS — E669 Obesity, unspecified: Secondary | ICD-10-CM | POA: Diagnosis not present

## 2018-05-24 DIAGNOSIS — M810 Age-related osteoporosis without current pathological fracture: Secondary | ICD-10-CM | POA: Diagnosis not present

## 2018-05-24 DIAGNOSIS — Z79899 Other long term (current) drug therapy: Secondary | ICD-10-CM | POA: Diagnosis not present

## 2018-05-24 DIAGNOSIS — E785 Hyperlipidemia, unspecified: Secondary | ICD-10-CM | POA: Diagnosis not present

## 2018-05-24 DIAGNOSIS — Z0001 Encounter for general adult medical examination with abnormal findings: Secondary | ICD-10-CM | POA: Diagnosis not present

## 2018-05-24 DIAGNOSIS — Z6833 Body mass index (BMI) 33.0-33.9, adult: Secondary | ICD-10-CM | POA: Diagnosis not present

## 2018-05-24 DIAGNOSIS — Z1331 Encounter for screening for depression: Secondary | ICD-10-CM | POA: Diagnosis not present

## 2018-05-24 DIAGNOSIS — Z23 Encounter for immunization: Secondary | ICD-10-CM | POA: Diagnosis not present

## 2018-05-24 DIAGNOSIS — Z1339 Encounter for screening examination for other mental health and behavioral disorders: Secondary | ICD-10-CM | POA: Diagnosis not present

## 2018-05-24 DIAGNOSIS — I10 Essential (primary) hypertension: Secondary | ICD-10-CM | POA: Diagnosis not present

## 2018-05-24 DIAGNOSIS — E559 Vitamin D deficiency, unspecified: Secondary | ICD-10-CM | POA: Diagnosis not present

## 2018-05-24 DIAGNOSIS — R5383 Other fatigue: Secondary | ICD-10-CM | POA: Diagnosis not present

## 2018-06-05 DIAGNOSIS — D649 Anemia, unspecified: Secondary | ICD-10-CM | POA: Diagnosis not present

## 2018-06-06 DIAGNOSIS — D649 Anemia, unspecified: Secondary | ICD-10-CM | POA: Diagnosis not present

## 2018-06-12 DIAGNOSIS — Z6833 Body mass index (BMI) 33.0-33.9, adult: Secondary | ICD-10-CM | POA: Diagnosis not present

## 2018-06-12 DIAGNOSIS — E669 Obesity, unspecified: Secondary | ICD-10-CM | POA: Diagnosis not present

## 2018-06-12 DIAGNOSIS — R062 Wheezing: Secondary | ICD-10-CM | POA: Diagnosis not present

## 2018-06-14 DIAGNOSIS — D509 Iron deficiency anemia, unspecified: Secondary | ICD-10-CM | POA: Diagnosis not present

## 2018-06-20 DIAGNOSIS — Z86711 Personal history of pulmonary embolism: Secondary | ICD-10-CM | POA: Diagnosis not present

## 2018-06-20 DIAGNOSIS — R634 Abnormal weight loss: Secondary | ICD-10-CM | POA: Diagnosis not present

## 2018-06-20 DIAGNOSIS — D509 Iron deficiency anemia, unspecified: Secondary | ICD-10-CM | POA: Diagnosis not present

## 2018-06-25 DIAGNOSIS — D509 Iron deficiency anemia, unspecified: Secondary | ICD-10-CM | POA: Diagnosis not present

## 2018-06-25 DIAGNOSIS — R42 Dizziness and giddiness: Secondary | ICD-10-CM | POA: Diagnosis not present

## 2018-06-25 DIAGNOSIS — Z6833 Body mass index (BMI) 33.0-33.9, adult: Secondary | ICD-10-CM | POA: Diagnosis not present

## 2018-06-25 DIAGNOSIS — I10 Essential (primary) hypertension: Secondary | ICD-10-CM | POA: Diagnosis not present

## 2018-06-26 DIAGNOSIS — D509 Iron deficiency anemia, unspecified: Secondary | ICD-10-CM | POA: Diagnosis not present

## 2018-07-09 DIAGNOSIS — Z7901 Long term (current) use of anticoagulants: Secondary | ICD-10-CM | POA: Diagnosis not present

## 2018-07-09 DIAGNOSIS — D500 Iron deficiency anemia secondary to blood loss (chronic): Secondary | ICD-10-CM | POA: Diagnosis not present

## 2018-07-10 DIAGNOSIS — D649 Anemia, unspecified: Secondary | ICD-10-CM | POA: Diagnosis not present

## 2018-07-25 DIAGNOSIS — D649 Anemia, unspecified: Secondary | ICD-10-CM

## 2018-07-25 HISTORY — DX: Anemia, unspecified: D64.9

## 2018-07-27 DIAGNOSIS — F329 Major depressive disorder, single episode, unspecified: Secondary | ICD-10-CM | POA: Diagnosis not present

## 2018-07-27 DIAGNOSIS — D500 Iron deficiency anemia secondary to blood loss (chronic): Secondary | ICD-10-CM | POA: Diagnosis not present

## 2018-07-27 DIAGNOSIS — M1990 Unspecified osteoarthritis, unspecified site: Secondary | ICD-10-CM | POA: Diagnosis not present

## 2018-07-27 DIAGNOSIS — D126 Benign neoplasm of colon, unspecified: Secondary | ICD-10-CM | POA: Diagnosis not present

## 2018-07-27 DIAGNOSIS — D120 Benign neoplasm of cecum: Secondary | ICD-10-CM | POA: Diagnosis not present

## 2018-07-27 DIAGNOSIS — I10 Essential (primary) hypertension: Secondary | ICD-10-CM | POA: Diagnosis not present

## 2018-07-27 DIAGNOSIS — Z7901 Long term (current) use of anticoagulants: Secondary | ICD-10-CM | POA: Diagnosis not present

## 2018-07-27 DIAGNOSIS — Z7951 Long term (current) use of inhaled steroids: Secondary | ICD-10-CM | POA: Diagnosis not present

## 2018-07-27 DIAGNOSIS — Q2733 Arteriovenous malformation of digestive system vessel: Secondary | ICD-10-CM | POA: Diagnosis not present

## 2018-07-27 DIAGNOSIS — Z79899 Other long term (current) drug therapy: Secondary | ICD-10-CM | POA: Diagnosis not present

## 2018-07-27 DIAGNOSIS — D122 Benign neoplasm of ascending colon: Secondary | ICD-10-CM | POA: Diagnosis not present

## 2018-07-27 DIAGNOSIS — J45909 Unspecified asthma, uncomplicated: Secondary | ICD-10-CM | POA: Diagnosis not present

## 2018-07-27 DIAGNOSIS — K635 Polyp of colon: Secondary | ICD-10-CM | POA: Diagnosis not present

## 2018-07-27 DIAGNOSIS — D649 Anemia, unspecified: Secondary | ICD-10-CM | POA: Diagnosis not present

## 2018-09-07 DIAGNOSIS — M25552 Pain in left hip: Secondary | ICD-10-CM | POA: Diagnosis not present

## 2018-09-07 DIAGNOSIS — M1612 Unilateral primary osteoarthritis, left hip: Secondary | ICD-10-CM | POA: Diagnosis not present

## 2018-09-12 DIAGNOSIS — D509 Iron deficiency anemia, unspecified: Secondary | ICD-10-CM | POA: Diagnosis not present

## 2018-09-12 DIAGNOSIS — E785 Hyperlipidemia, unspecified: Secondary | ICD-10-CM | POA: Diagnosis not present

## 2018-09-12 DIAGNOSIS — F5101 Primary insomnia: Secondary | ICD-10-CM | POA: Diagnosis not present

## 2018-09-12 DIAGNOSIS — Z79899 Other long term (current) drug therapy: Secondary | ICD-10-CM | POA: Diagnosis not present

## 2018-09-12 DIAGNOSIS — I10 Essential (primary) hypertension: Secondary | ICD-10-CM | POA: Diagnosis not present

## 2018-09-12 DIAGNOSIS — J4520 Mild intermittent asthma, uncomplicated: Secondary | ICD-10-CM | POA: Diagnosis not present

## 2018-09-12 DIAGNOSIS — Z6832 Body mass index (BMI) 32.0-32.9, adult: Secondary | ICD-10-CM | POA: Diagnosis not present

## 2018-09-17 ENCOUNTER — Ambulatory Visit: Payer: Self-pay | Admitting: Psychiatry

## 2018-09-17 DIAGNOSIS — E669 Obesity, unspecified: Secondary | ICD-10-CM | POA: Diagnosis not present

## 2018-09-17 DIAGNOSIS — R05 Cough: Secondary | ICD-10-CM | POA: Diagnosis not present

## 2018-09-17 DIAGNOSIS — Z6832 Body mass index (BMI) 32.0-32.9, adult: Secondary | ICD-10-CM | POA: Diagnosis not present

## 2018-09-17 DIAGNOSIS — R062 Wheezing: Secondary | ICD-10-CM | POA: Diagnosis not present

## 2018-09-20 DIAGNOSIS — M1612 Unilateral primary osteoarthritis, left hip: Secondary | ICD-10-CM | POA: Diagnosis not present

## 2018-09-20 DIAGNOSIS — M25552 Pain in left hip: Secondary | ICD-10-CM | POA: Diagnosis not present

## 2018-10-02 DIAGNOSIS — Z08 Encounter for follow-up examination after completed treatment for malignant neoplasm: Secondary | ICD-10-CM | POA: Diagnosis not present

## 2018-10-02 DIAGNOSIS — L578 Other skin changes due to chronic exposure to nonionizing radiation: Secondary | ICD-10-CM | POA: Diagnosis not present

## 2018-10-02 DIAGNOSIS — D0461 Carcinoma in situ of skin of right upper limb, including shoulder: Secondary | ICD-10-CM | POA: Diagnosis not present

## 2018-10-02 DIAGNOSIS — Z85828 Personal history of other malignant neoplasm of skin: Secondary | ICD-10-CM | POA: Diagnosis not present

## 2018-10-02 DIAGNOSIS — D485 Neoplasm of uncertain behavior of skin: Secondary | ICD-10-CM | POA: Diagnosis not present

## 2018-10-02 DIAGNOSIS — L821 Other seborrheic keratosis: Secondary | ICD-10-CM | POA: Diagnosis not present

## 2018-10-02 DIAGNOSIS — L570 Actinic keratosis: Secondary | ICD-10-CM | POA: Diagnosis not present

## 2018-10-09 ENCOUNTER — Ambulatory Visit: Payer: Self-pay | Admitting: Psychiatry

## 2018-10-30 ENCOUNTER — Other Ambulatory Visit: Payer: Self-pay

## 2018-10-30 MED ORDER — VENLAFAXINE HCL ER 75 MG PO CP24: 75.0000 mg | ORAL_CAPSULE | Freq: Every day | ORAL | 2 refills | Status: DC

## 2018-10-30 MED ORDER — QUETIAPINE FUMARATE 200 MG PO TABS: 400.0000 mg | ORAL_TABLET | Freq: Every day | ORAL | 2 refills | Status: DC

## 2018-12-05 DIAGNOSIS — J40 Bronchitis, not specified as acute or chronic: Secondary | ICD-10-CM | POA: Diagnosis not present

## 2018-12-05 DIAGNOSIS — Z6831 Body mass index (BMI) 31.0-31.9, adult: Secondary | ICD-10-CM | POA: Diagnosis not present

## 2018-12-05 DIAGNOSIS — M79671 Pain in right foot: Secondary | ICD-10-CM | POA: Diagnosis not present

## 2018-12-12 DIAGNOSIS — D649 Anemia, unspecified: Secondary | ICD-10-CM | POA: Diagnosis not present

## 2018-12-12 DIAGNOSIS — L723 Sebaceous cyst: Secondary | ICD-10-CM | POA: Diagnosis not present

## 2018-12-12 DIAGNOSIS — E785 Hyperlipidemia, unspecified: Secondary | ICD-10-CM | POA: Diagnosis not present

## 2018-12-12 DIAGNOSIS — R109 Unspecified abdominal pain: Secondary | ICD-10-CM | POA: Diagnosis not present

## 2018-12-12 DIAGNOSIS — I10 Essential (primary) hypertension: Secondary | ICD-10-CM | POA: Diagnosis not present

## 2018-12-12 DIAGNOSIS — Z79899 Other long term (current) drug therapy: Secondary | ICD-10-CM | POA: Diagnosis not present

## 2018-12-12 DIAGNOSIS — Z6831 Body mass index (BMI) 31.0-31.9, adult: Secondary | ICD-10-CM | POA: Diagnosis not present

## 2018-12-12 DIAGNOSIS — L089 Local infection of the skin and subcutaneous tissue, unspecified: Secondary | ICD-10-CM | POA: Diagnosis not present

## 2018-12-12 DIAGNOSIS — K219 Gastro-esophageal reflux disease without esophagitis: Secondary | ICD-10-CM | POA: Diagnosis not present

## 2019-01-29 ENCOUNTER — Other Ambulatory Visit: Payer: Self-pay | Admitting: Psychiatry

## 2019-01-30 DIAGNOSIS — R634 Abnormal weight loss: Secondary | ICD-10-CM | POA: Diagnosis not present

## 2019-01-30 DIAGNOSIS — E785 Hyperlipidemia, unspecified: Secondary | ICD-10-CM | POA: Diagnosis not present

## 2019-01-30 DIAGNOSIS — K219 Gastro-esophageal reflux disease without esophagitis: Secondary | ICD-10-CM | POA: Diagnosis not present

## 2019-01-30 DIAGNOSIS — Z6830 Body mass index (BMI) 30.0-30.9, adult: Secondary | ICD-10-CM | POA: Diagnosis not present

## 2019-01-30 DIAGNOSIS — I10 Essential (primary) hypertension: Secondary | ICD-10-CM | POA: Diagnosis not present

## 2019-01-30 DIAGNOSIS — L089 Local infection of the skin and subcutaneous tissue, unspecified: Secondary | ICD-10-CM | POA: Diagnosis not present

## 2019-01-30 DIAGNOSIS — L723 Sebaceous cyst: Secondary | ICD-10-CM | POA: Diagnosis not present

## 2019-01-30 DIAGNOSIS — Z86711 Personal history of pulmonary embolism: Secondary | ICD-10-CM | POA: Diagnosis not present

## 2019-01-31 ENCOUNTER — Other Ambulatory Visit: Payer: Self-pay | Admitting: Psychiatry

## 2019-01-31 NOTE — Telephone Encounter (Signed)
Pt has been called several times to set up an appt.

## 2019-02-05 DIAGNOSIS — N907 Vulvar cyst: Secondary | ICD-10-CM | POA: Diagnosis not present

## 2019-02-05 DIAGNOSIS — L089 Local infection of the skin and subcutaneous tissue, unspecified: Secondary | ICD-10-CM | POA: Diagnosis not present

## 2019-02-19 DIAGNOSIS — M1612 Unilateral primary osteoarthritis, left hip: Secondary | ICD-10-CM | POA: Diagnosis not present

## 2019-02-21 DIAGNOSIS — L089 Local infection of the skin and subcutaneous tissue, unspecified: Secondary | ICD-10-CM | POA: Diagnosis not present

## 2019-02-21 DIAGNOSIS — N907 Vulvar cyst: Secondary | ICD-10-CM | POA: Diagnosis not present

## 2019-02-25 ENCOUNTER — Other Ambulatory Visit: Payer: Self-pay

## 2019-02-26 DIAGNOSIS — M25552 Pain in left hip: Secondary | ICD-10-CM | POA: Diagnosis not present

## 2019-02-26 DIAGNOSIS — M1612 Unilateral primary osteoarthritis, left hip: Secondary | ICD-10-CM | POA: Diagnosis not present

## 2019-02-28 ENCOUNTER — Other Ambulatory Visit: Payer: Self-pay | Admitting: Psychiatry

## 2019-03-01 NOTE — Telephone Encounter (Signed)
Pt did finally call back for an appt scheduled 03/22/2019

## 2019-03-22 ENCOUNTER — Ambulatory Visit (INDEPENDENT_AMBULATORY_CARE_PROVIDER_SITE_OTHER): Payer: PPO | Admitting: Psychiatry

## 2019-03-22 ENCOUNTER — Other Ambulatory Visit: Payer: Self-pay

## 2019-03-22 ENCOUNTER — Encounter: Payer: Self-pay | Admitting: Psychiatry

## 2019-03-22 DIAGNOSIS — F5105 Insomnia due to other mental disorder: Secondary | ICD-10-CM | POA: Diagnosis not present

## 2019-03-22 DIAGNOSIS — F411 Generalized anxiety disorder: Secondary | ICD-10-CM

## 2019-03-22 DIAGNOSIS — Z87898 Personal history of other specified conditions: Secondary | ICD-10-CM

## 2019-03-22 DIAGNOSIS — F323 Major depressive disorder, single episode, severe with psychotic features: Secondary | ICD-10-CM | POA: Diagnosis not present

## 2019-03-22 NOTE — Progress Notes (Signed)
[name] [id number] Aug 15, 1940 78 y.o.  Virtual Visit via Telephone Note  I connected with pt on 03/22/19 at 10:00 AM EDT by telephone and verified that I am speaking with the correct person using two identifiers.   I discussed the limitations, risks, security and privacy concerns of performing an evaluation and management service by telephone and the availability of in person appointments. I also discussed with the patient that there may be a patient responsible charge related to this service. The patient expressed understanding and agreed to proceed.   I discussed the assessment and treatment plan with the patient. The patient was provided an opportunity to ask questions and all were answered. The patient agreed with the plan and demonstrated an understanding of the instructions.   The patient was advised to call back or seek an in-person evaluation if the symptoms worsen or if the condition fails to improve as anticipated.  I provided 15 minutes of non-face-to-face time during this encounter.  The patient was located at home.  The provider was located at [place].   Purnell Shoemaker, MD   Patient lacks the equipment to do a video visit.  Subjective:   Patient ID:  [name] is a 77 y.o. (DOB 09-Dec-1940) female.  Chief Complaint:  Chief Complaint  Patient presents with  . Follow-up    Medication Management  . Anxiety  . Sleeping Problem    HPI [name] presents for follow-up of major depression versus bipolar disorder with history of psychosis, generalized anxiety disorder, chronic insomnia, history of delirium, history of fibromyalgia.  She was last seen August 2019.  There was an attempt to reduce her lorazepam from 2 mg to 1.5 mg nightly  Her husband died 05/17/2018 and she asked to increase the lorazepam somewhat.  It was written for lorazepam 0.5 mg 3 nightly and 1 daily as needed anxiety.  Emotionally doing OK. Physically bad with knee  pain.   Had a lot to go through after H died.  Was very busy dealing with it.  Not driving.  Has helper that comes in.    December anemic with low iron.  Refused transfusion but got iron IV. Better now.  Generally lorazepam 0.5 mg 3 tablets HS and rare extra.  2 kids [place] MR in facility.  Patient reports stable mood and denies depressed or irritable moods.  Patient denies any recent difficulty with anxiety.  Patient denies difficulty with sleep initiation or maintenance but only with the meds. Has appetite disturbance.  Patient reports that energy and motivation have been good.  Patient denies any difficulty with concentration.  Patient denies any suicidal ideation.  Past Psychiatric Medication Trials: Lexapro, venlafaxine side effects, mirtazapine Olanzapine, risperidone, lithium, Lyrica sedation, Seroquel, gabapentin, alprazolam, propranolol, clonazepam, Belsomra, Concerta, lithium, Adderall, nefazodone, Cytomel Under our care since January 1997  Review of Systems:  Review of Systems  Musculoskeletal: Positive for arthralgias and gait problem.  Neurological: Positive for weakness. Negative for tremors.    Medications: I have reviewed the patient's current medications.  Current Outpatient Medications  Medication Sig Dispense Refill  . albuterol (VENTOLIN HFA) 108 (90 Base) MCG/ACT inhaler INHALE 2 PUFFS EVERY 4 HOURS AS NEEDED FOR WHEEZING    . amLODipine (NORVASC) 5 MG tablet Take 5 mg by mouth daily.  11  . apixaban (ELIQUIS) 2.5 MG TABS tablet     . BELSOMRA 15 MG TABS Take 1 tablet by mouth at bedtime.    Marland Kitchen  budesonide-formoterol (SYMBICORT) 160-4.5 MCG/ACT inhaler Inhale into the lungs.    . Calcium Citrate 200 MG TABS Take by mouth.    . famotidine (PEPCID) 40 MG tablet     . gabapentin (NEURONTIN) 100 MG capsule Take 100 mg by mouth at bedtime.    . hyoscyamine (ANASPAZ) 0.125 MG TBDP disintergrating tablet PLACE 1 tablet on the tongue and allow to dissolve as  needed every 4 HOURS for stomach cramps    . irbesartan (AVAPRO) 150 MG tablet Take 150 mg by mouth.    Marland Kitchen LORazepam (ATIVAN) 0.5 MG tablet TAKE ONE TABLET BY MOUTH 4 TIMES DAILY AS NEEDED (MAX 4 TABLETS DAILY) 120 tablet 0  . omeprazole (PRILOSEC) 20 MG capsule Take 20 mg by mouth daily.  11  . oxybutynin (DITROPAN XL) 15 MG 24 hr tablet Take 15 mg by mouth at bedtime.     . pravastatin (PRAVACHOL) 40 MG tablet Take 40 mg by mouth daily.  6  . QUEtiapine (SEROQUEL) 200 MG tablet Take 2 tablets (400 mg total) by mouth at bedtime. 60 tablet 0  . venlafaxine XR (EFFEXOR-XR) 75 MG 24 hr capsule Take 1 capsule (75 mg total) by mouth daily with breakfast. 30 capsule 0   No current facility-administered medications for this visit.     Medication Side Effects: None  Allergies:  Allergies  Allergen Reactions  . Sulfa Antibiotics Rash    Pt. Stated," a rash."    History reviewed. No pertinent past medical history.  History reviewed. No pertinent family history.  Social History   Socioeconomic History  . Marital status: Married    Spouse name: Not on file  . Number of children: Not on file  . Years of education: Not on file  . Highest education level: Not on file  Occupational History  . Not on file  Social Needs  . Financial resource strain: Not on file  . Food insecurity    Worry: Not on file    Inability: Not on file  . Transportation needs    Medical: Not on file    Non-medical: Not on file  Tobacco Use  . Smoking status: Never Smoker  . Smokeless tobacco: Never Used  Substance and Sexual Activity  . Alcohol use: Not on file  . Drug use: Not on file  . Sexual activity: Not on file  Lifestyle  . Physical activity    Days per week: Not on file    Minutes per session: Not on file  . Stress: Not on file  Relationships  . Social Herbalist on phone: Not on file    Gets together: Not on file    Attends religious service: Not on file    Active member of club or  organization: Not on file    Attends meetings of clubs or organizations: Not on file    Relationship status: Not on file  . Intimate partner violence    Fear of current or ex partner: Not on file    Emotionally abused: Not on file    Physically abused: Not on file    Forced sexual activity: Not on file  Other Topics Concern  . Not on file  Social History Narrative  . Not on file    Past Medical History, Surgical history, Social history, and Family history were reviewed and updated as appropriate.   Please see review of systems for further details on the patient's review from today.   Objective:   Physical  Exam:  There were no vitals taken for this visit.  Physical Exam Neurological:     Mental Status: She is alert and oriented to person, place, and time.     Cranial Nerves: No dysarthria.  Psychiatric:        Attention and Perception: Attention normal.        Mood and Affect: Mood normal.        Speech: Speech normal.        Behavior: Behavior is cooperative.        Thought Content: Thought content normal. Thought content is not paranoid or delusional. Thought content does not include homicidal or suicidal ideation. Thought content does not include homicidal or suicidal plan.        Cognition and Memory: Cognition and memory normal.        Judgment: Judgment normal.     Comments: Insight good.     Lab Review:     Component Value Date/Time   NA 139 10/31/2007 0515   K 3.7 10/31/2007 0515   CL 100 10/31/2007 0515   CO2 29 10/31/2007 0515   GLUCOSE 109 (H) 10/31/2007 0515   BUN 8 10/31/2007 0515   CREATININE 0.96 10/31/2007 0515   CALCIUM 8.9 10/31/2007 0515   PROT 6.5 10/22/2007 1016   ALBUMIN 3.8 10/22/2007 1016   AST 23 10/22/2007 1016   ALT 22 10/22/2007 1016   ALKPHOS 129 (H) 10/22/2007 1016   BILITOT 0.5 10/22/2007 1016   GFRNONAA 58 (L) 10/31/2007 0515   GFRAA  10/31/2007 0515    >60        The eGFR has been calculated using the MDRD equation. This  calculation has not been validated in all clinical       Component Value Date/Time   WBC 7.2 10/31/2007 0515   RBC 3.31 (L) 10/31/2007 0515   HGB 10.2 (L) 10/31/2007 0515   HCT 29.9 (L) 10/31/2007 0515   PLT 259 10/31/2007 0515   MCV 90.2 10/31/2007 0515   MCHC 34.1 10/31/2007 0515   RDW 15.6 (H) 10/31/2007 0515   LYMPHSABS 2.4 10/22/2007 1016   MONOABS 0.6 10/22/2007 1016   EOSABS 0.3 10/22/2007 1016   BASOSABS 0.0 10/22/2007 1016    No results found for: POCLITH, LITHIUM   No results found for: PHENYTOIN, PHENOBARB, VALPROATE, CBMZ   .res Assessment: Plan:    [name] was seen today for follow-up, anxiety and sleeping problem.  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  History of psychosis and delirium but is stable for an extended period.  Benefit meds and tolerating them.  Needs antipsychotic bc benefit mood and anxiety and history of psychosis.  There is been a question about whether primary diagnosis is bipolar disorder or major depression but in either case she has been responsive to the medications and has not had recent mania.  Discussed potential metabolic side effects associated with atypical antipsychotics, as well as potential risk for movement side effects. Advised pt to contact office if movement side effects occur.   We discussed the short-term risks associated with benzodiazepines including sedation and increased fall risk among others.  Discussed long-term side effect risk including dependence, potential withdrawal symptoms, and the potential eventual dose-related risk of dementia.  No med changes.  She doesn't feel she can reduce the Ativan further at this time.  FU 6 mos  Lynder Parents, MD, DFAPA  Please see After Visit Summary for patient specific instructions.  No future  appointments.  No orders of the defined types were placed in this encounter.     -------------------------------

## 2019-03-27 ENCOUNTER — Other Ambulatory Visit: Payer: Self-pay | Admitting: Psychiatry

## 2019-04-03 ENCOUNTER — Other Ambulatory Visit: Payer: Self-pay | Admitting: Psychiatry

## 2019-04-04 DIAGNOSIS — Z08 Encounter for follow-up examination after completed treatment for malignant neoplasm: Secondary | ICD-10-CM | POA: Diagnosis not present

## 2019-04-04 DIAGNOSIS — L578 Other skin changes due to chronic exposure to nonionizing radiation: Secondary | ICD-10-CM | POA: Diagnosis not present

## 2019-04-04 DIAGNOSIS — D692 Other nonthrombocytopenic purpura: Secondary | ICD-10-CM | POA: Diagnosis not present

## 2019-04-04 DIAGNOSIS — Z85828 Personal history of other malignant neoplasm of skin: Secondary | ICD-10-CM | POA: Diagnosis not present

## 2019-04-04 DIAGNOSIS — D485 Neoplasm of uncertain behavior of skin: Secondary | ICD-10-CM | POA: Diagnosis not present

## 2019-04-04 DIAGNOSIS — L570 Actinic keratosis: Secondary | ICD-10-CM | POA: Diagnosis not present

## 2019-04-04 DIAGNOSIS — C44319 Basal cell carcinoma of skin of other parts of face: Secondary | ICD-10-CM | POA: Diagnosis not present

## 2019-04-04 NOTE — Telephone Encounter (Signed)
Should be due 04/08/2019, any refills?  Patient scheduled back in Feb. 2021

## 2019-04-11 DIAGNOSIS — M1612 Unilateral primary osteoarthritis, left hip: Secondary | ICD-10-CM | POA: Diagnosis not present

## 2019-04-29 ENCOUNTER — Other Ambulatory Visit: Payer: Self-pay | Admitting: Psychiatry

## 2019-05-06 DIAGNOSIS — M1612 Unilateral primary osteoarthritis, left hip: Secondary | ICD-10-CM | POA: Diagnosis not present

## 2019-05-06 DIAGNOSIS — E785 Hyperlipidemia, unspecified: Secondary | ICD-10-CM | POA: Diagnosis not present

## 2019-05-06 DIAGNOSIS — M25552 Pain in left hip: Secondary | ICD-10-CM | POA: Diagnosis not present

## 2019-05-06 DIAGNOSIS — M810 Age-related osteoporosis without current pathological fracture: Secondary | ICD-10-CM | POA: Diagnosis not present

## 2019-05-06 DIAGNOSIS — M25473 Effusion, unspecified ankle: Secondary | ICD-10-CM | POA: Diagnosis not present

## 2019-05-06 DIAGNOSIS — D509 Iron deficiency anemia, unspecified: Secondary | ICD-10-CM | POA: Diagnosis not present

## 2019-05-06 DIAGNOSIS — I10 Essential (primary) hypertension: Secondary | ICD-10-CM | POA: Diagnosis not present

## 2019-05-06 DIAGNOSIS — Z6830 Body mass index (BMI) 30.0-30.9, adult: Secondary | ICD-10-CM | POA: Diagnosis not present

## 2019-05-06 DIAGNOSIS — Z79899 Other long term (current) drug therapy: Secondary | ICD-10-CM | POA: Diagnosis not present

## 2019-05-14 DIAGNOSIS — C44319 Basal cell carcinoma of skin of other parts of face: Secondary | ICD-10-CM | POA: Diagnosis not present

## 2019-05-14 DIAGNOSIS — L304 Erythema intertrigo: Secondary | ICD-10-CM | POA: Diagnosis not present

## 2019-05-20 DIAGNOSIS — M79605 Pain in left leg: Secondary | ICD-10-CM | POA: Diagnosis not present

## 2019-05-20 DIAGNOSIS — M25552 Pain in left hip: Secondary | ICD-10-CM | POA: Diagnosis not present

## 2019-05-20 DIAGNOSIS — B354 Tinea corporis: Secondary | ICD-10-CM | POA: Diagnosis not present

## 2019-05-20 DIAGNOSIS — M79662 Pain in left lower leg: Secondary | ICD-10-CM | POA: Diagnosis not present

## 2019-05-20 DIAGNOSIS — M79661 Pain in right lower leg: Secondary | ICD-10-CM | POA: Diagnosis not present

## 2019-05-20 DIAGNOSIS — M1612 Unilateral primary osteoarthritis, left hip: Secondary | ICD-10-CM | POA: Diagnosis not present

## 2019-05-20 DIAGNOSIS — M79604 Pain in right leg: Secondary | ICD-10-CM | POA: Diagnosis not present

## 2019-05-27 DIAGNOSIS — Z01818 Encounter for other preprocedural examination: Secondary | ICD-10-CM | POA: Diagnosis not present

## 2019-05-27 DIAGNOSIS — I10 Essential (primary) hypertension: Secondary | ICD-10-CM | POA: Diagnosis not present

## 2019-05-27 DIAGNOSIS — R062 Wheezing: Secondary | ICD-10-CM | POA: Diagnosis not present

## 2019-05-27 DIAGNOSIS — Z6829 Body mass index (BMI) 29.0-29.9, adult: Secondary | ICD-10-CM | POA: Diagnosis not present

## 2019-05-29 DIAGNOSIS — I10 Essential (primary) hypertension: Secondary | ICD-10-CM | POA: Diagnosis not present

## 2019-05-29 DIAGNOSIS — Z7901 Long term (current) use of anticoagulants: Secondary | ICD-10-CM | POA: Diagnosis not present

## 2019-05-29 DIAGNOSIS — Z6829 Body mass index (BMI) 29.0-29.9, adult: Secondary | ICD-10-CM | POA: Diagnosis not present

## 2019-06-03 ENCOUNTER — Telehealth: Payer: Self-pay | Admitting: Psychiatry

## 2019-06-03 NOTE — Telephone Encounter (Signed)
Please notify pharmacy is okay to refill early

## 2019-06-03 NOTE — Telephone Encounter (Signed)
Her caregiver reminded her that when she picked up the med to begin with and brought it home, she accidentally knocked over the bottle of lorazepam and some went down the vent in the floor. She had forgotten this until the caregiver reminded her. That is why she is short.

## 2019-06-03 NOTE — Telephone Encounter (Signed)
Patient called and said that she is completely out of her lorazapam and the pharmacy will not fill until Thursday. So she needs dr. Clovis Pu to approve an early refill. She said that she has taken as directed. The pharmacy said they would fill it early if dr. cotltle approves it

## 2019-06-04 NOTE — Telephone Encounter (Signed)
[facility] notified to refill early

## 2019-06-12 DIAGNOSIS — Z01818 Encounter for other preprocedural examination: Secondary | ICD-10-CM | POA: Diagnosis not present

## 2019-06-12 DIAGNOSIS — D649 Anemia, unspecified: Secondary | ICD-10-CM | POA: Diagnosis not present

## 2019-06-17 DIAGNOSIS — Z6829 Body mass index (BMI) 29.0-29.9, adult: Secondary | ICD-10-CM | POA: Diagnosis not present

## 2019-06-17 DIAGNOSIS — J4520 Mild intermittent asthma, uncomplicated: Secondary | ICD-10-CM | POA: Diagnosis not present

## 2019-06-17 DIAGNOSIS — M1612 Unilateral primary osteoarthritis, left hip: Secondary | ICD-10-CM | POA: Diagnosis not present

## 2019-06-17 DIAGNOSIS — Z1339 Encounter for screening examination for other mental health and behavioral disorders: Secondary | ICD-10-CM | POA: Diagnosis not present

## 2019-06-17 DIAGNOSIS — I7090 Unspecified atherosclerosis: Secondary | ICD-10-CM | POA: Diagnosis not present

## 2019-06-17 DIAGNOSIS — Z0000 Encounter for general adult medical examination without abnormal findings: Secondary | ICD-10-CM | POA: Diagnosis not present

## 2019-06-17 DIAGNOSIS — D649 Anemia, unspecified: Secondary | ICD-10-CM | POA: Diagnosis not present

## 2019-06-17 DIAGNOSIS — Z1331 Encounter for screening for depression: Secondary | ICD-10-CM | POA: Diagnosis not present

## 2019-06-17 DIAGNOSIS — Z86711 Personal history of pulmonary embolism: Secondary | ICD-10-CM | POA: Diagnosis not present

## 2019-06-19 ENCOUNTER — Other Ambulatory Visit: Payer: Self-pay

## 2019-07-01 ENCOUNTER — Other Ambulatory Visit: Payer: Self-pay | Admitting: Psychiatry

## 2019-07-01 NOTE — Telephone Encounter (Signed)
Okay to send 

## 2019-07-03 ENCOUNTER — Other Ambulatory Visit: Payer: Self-pay

## 2019-07-09 DIAGNOSIS — D649 Anemia, unspecified: Secondary | ICD-10-CM | POA: Diagnosis not present

## 2019-07-10 DIAGNOSIS — D649 Anemia, unspecified: Secondary | ICD-10-CM | POA: Diagnosis not present

## 2019-07-17 DIAGNOSIS — M810 Age-related osteoporosis without current pathological fracture: Secondary | ICD-10-CM | POA: Diagnosis not present

## 2019-07-17 DIAGNOSIS — M8589 Other specified disorders of bone density and structure, multiple sites: Secondary | ICD-10-CM | POA: Diagnosis not present

## 2019-07-17 DIAGNOSIS — D509 Iron deficiency anemia, unspecified: Secondary | ICD-10-CM | POA: Diagnosis not present

## 2019-07-31 DIAGNOSIS — D509 Iron deficiency anemia, unspecified: Secondary | ICD-10-CM | POA: Diagnosis not present

## 2019-08-07 DIAGNOSIS — D509 Iron deficiency anemia, unspecified: Secondary | ICD-10-CM | POA: Diagnosis not present

## 2019-08-16 DIAGNOSIS — N390 Urinary tract infection, site not specified: Secondary | ICD-10-CM | POA: Diagnosis not present

## 2019-08-28 DIAGNOSIS — Z79899 Other long term (current) drug therapy: Secondary | ICD-10-CM | POA: Diagnosis not present

## 2019-09-25 DIAGNOSIS — M810 Age-related osteoporosis without current pathological fracture: Secondary | ICD-10-CM | POA: Diagnosis not present

## 2019-09-27 ENCOUNTER — Other Ambulatory Visit: Payer: Self-pay | Admitting: Psychiatry

## 2019-09-27 NOTE — Telephone Encounter (Signed)
Last visit 02/2019 due back 6 months nothing scheduled yet

## 2019-10-15 DIAGNOSIS — R229 Localized swelling, mass and lump, unspecified: Secondary | ICD-10-CM | POA: Diagnosis not present

## 2019-10-15 DIAGNOSIS — F331 Major depressive disorder, recurrent, moderate: Secondary | ICD-10-CM | POA: Diagnosis not present

## 2019-10-17 DIAGNOSIS — Z79899 Other long term (current) drug therapy: Secondary | ICD-10-CM | POA: Diagnosis not present

## 2019-10-17 DIAGNOSIS — I10 Essential (primary) hypertension: Secondary | ICD-10-CM | POA: Diagnosis not present

## 2019-10-17 DIAGNOSIS — E785 Hyperlipidemia, unspecified: Secondary | ICD-10-CM | POA: Diagnosis not present

## 2019-10-17 DIAGNOSIS — R7301 Impaired fasting glucose: Secondary | ICD-10-CM | POA: Diagnosis not present

## 2019-10-17 DIAGNOSIS — D509 Iron deficiency anemia, unspecified: Secondary | ICD-10-CM | POA: Diagnosis not present

## 2019-10-25 ENCOUNTER — Other Ambulatory Visit: Payer: Self-pay | Admitting: Psychiatry

## 2019-11-06 ENCOUNTER — Other Ambulatory Visit (HOSPITAL_COMMUNITY): Payer: Self-pay | Admitting: Orthopedic Surgery

## 2019-11-06 DIAGNOSIS — Z01818 Encounter for other preprocedural examination: Secondary | ICD-10-CM

## 2019-11-07 ENCOUNTER — Ambulatory Visit (HOSPITAL_COMMUNITY): Admission: RE | Admit: 2019-11-07 | Payer: PPO | Source: Ambulatory Visit

## 2019-11-08 ENCOUNTER — Other Ambulatory Visit (HOSPITAL_COMMUNITY): Payer: Self-pay | Admitting: Orthopedic Surgery

## 2019-11-08 ENCOUNTER — Other Ambulatory Visit: Payer: Self-pay

## 2019-11-08 DIAGNOSIS — M79661 Pain in right lower leg: Secondary | ICD-10-CM

## 2019-11-08 DIAGNOSIS — M79604 Pain in right leg: Secondary | ICD-10-CM

## 2019-11-08 DIAGNOSIS — M79605 Pain in left leg: Secondary | ICD-10-CM

## 2019-11-11 ENCOUNTER — Ambulatory Visit: Payer: Self-pay | Admitting: Orthopedic Surgery

## 2019-11-11 ENCOUNTER — Ambulatory Visit (HOSPITAL_COMMUNITY): Payer: PPO

## 2019-11-14 ENCOUNTER — Ambulatory Visit (HOSPITAL_COMMUNITY)
Admission: RE | Admit: 2019-11-14 | Discharge: 2019-11-14 | Disposition: A | Discharge status: Home / Self Care | Payer: PPO | Source: Ambulatory Visit | Attending: Cardiology | Admitting: Cardiology

## 2019-11-14 ENCOUNTER — Other Ambulatory Visit: Payer: Self-pay

## 2019-11-14 DIAGNOSIS — M79661 Pain in right lower leg: Secondary | ICD-10-CM | POA: Diagnosis not present

## 2019-11-14 DIAGNOSIS — M79662 Pain in left lower leg: Secondary | ICD-10-CM | POA: Diagnosis not present

## 2019-11-14 DIAGNOSIS — M79604 Pain in right leg: Secondary | ICD-10-CM | POA: Insufficient documentation

## 2019-11-14 DIAGNOSIS — M79605 Pain in left leg: Secondary | ICD-10-CM | POA: Diagnosis not present

## 2019-11-18 NOTE — Patient Instructions (Signed)
DUE TO COVID-19 ONLY ONE VISITOR IS ALLOWED TO COME WITH YOU AND STAY IN THE WAITING ROOM ONLY DURING PRE OP AND PROCEDURE DAY OF SURGERY. THE 2 VISITORS MAY VISIT WITH YOU AFTER SURGERY IN YOUR PRIVATE ROOM DURING VISITING HOURS ONLY!  YOU NEED TO HAVE A COVID 19 TEST ON__4/29____ @___1 :45____, THIS TEST MUST BE DONE BEFORE SURGERY, COME  [address], [place] , [address].  ([place]) ONCE YOUR COVID TEST IS COMPLETED, PLEASE BEGIN THE QUARANTINE INSTRUCTIONS AS OUTLINED IN YOUR HANDOUT.                [name]    Your procedure is scheduled on: 11/28/19   Report to [facility] Main  Entrance   Report to Short stay at  5:30 AM     Call this number if you have problems the morning of surgery [place], NO CHEWING GUM [place].   Do not eat food After Midnight.   YOU MAY HAVE CLEAR LIQUIDS FROM MIDNIGHT UNTIL 4:30 AM.   At 4:30 AM Please finish the prescribed Pre-Surgery  drink  . Nothing by mouth after you finish the  drink !   Take these medicines the morning of surgery with A SIP OF WATER: Venlafaxine XR, Quetiapine, Amlodipine, Omeprazole             Use your inhalers and bring them with you to the hospital                                 You may not have any metal on your body including hair pins and              piercings  Do not wear jewelry, make-up, lotions, powders or perfumes, deodorant             Do not wear nail polish on your fingernails.  Do not shave  48 hours prior to surgery.               Do not bring valuables to the hospital. [place].  Contacts, dentures or bridgework may not be worn into surgery.                 Please read over the following fact sheets you were given: _____________________________________________________________________             [facility] - Preparing for  Surgery Before surgery, you can play an important role.   Because skin is not sterile, your skin needs to be as free of germs as possible .  You can reduce the number of germs on your skin by washing with CHG (chlorahexidine gluconate) soap before surgery.   CHG is an antiseptic cleaner which kills germs and bonds with the skin to continue killing germs even after washing. Please DO NOT use if you have an allergy to CHG or antibacterial soaps.   If your skin becomes reddened/irritated stop using the CHG and inform your nurse when you arrive at Short Stay. Do not shave (including legs and underarms) for at least 48 hours prior to the first CHG shower.    Please follow these instructions carefully:  1.  Shower with CHG Soap the night  before surgery and the  morning of Surgery.  2.  If you choose to wash your hair, wash your hair first as usual with your  normal  shampoo.  3.  After you shampoo, rinse your hair and body thoroughly to remove the  shampoo.                                        4.  Use CHG as you would any other liquid soap.  You can apply chg directly  to the skin and wash                       Gently with a scrungie or clean washcloth.  5.  Apply the CHG Soap to your body ONLY FROM THE NECK DOWN.   Do not use on face/ open                           Wound or open sores. Avoid contact with eyes, ears mouth and genitals (private parts).                       Wash face,  Genitals (private parts) with your normal soap.             6.  Wash thoroughly, paying special attention to the area where your surgery  will be performed.  7.  Thoroughly rinse your body with warm water from the neck down.  8.  DO NOT shower/wash with your normal soap after using and rinsing off  the CHG Soap.             9.  Pat yourself dry with a clean towel.            10.  Wear clean pajamas.            11.  Place clean sheets on your bed the night of your first shower and do not  sleep with pets. Day of  Surgery : Do not apply any lotions/deodorants the morning of surgery.  Please wear clean clothes to the hospital/surgery center.  FAILURE TO FOLLOW THESE INSTRUCTIONS MAY RESULT IN THE CANCELLATION OF YOUR SURGERY PATIENT SIGNATURE_________________________________  NURSE SIGNATURE__________________________________  ___  Incentive Spirometer  An incentive spirometer is a tool that can help keep your lungs clear and active. This tool measures how well you are filling your lungs with each breath. Taking long deep breaths may help reverse or decrease the chance of developing breathing (pulmonary) problems (especially infection) following:  A long period of time when you are unable to move or be active. BEFORE THE PROCEDURE   If the spirometer includes an indicator to show your best effort, your nurse or respiratory therapist will set it to a desired goal.  If possible, sit up straight or lean slightly forward. Try not to slouch.  Hold the incentive spirometer in an upright position. INSTRUCTIONS FOR USE  1. Sit on the edge of your bed if possible, or sit up as far as you can in bed or on a chair. 2. Hold the incentive spirometer in an upright position. 3. Breathe out normally. 4. Place the mouthpiece in your mouth and seal your lips tightly around it. 5. Breathe in slowly and as deeply as possible, raising the piston or the ball toward the top of the  column. 6. Hold your breath for 3-5 seconds or for as long as possible. Allow the piston or ball to fall to the bottom of the column. 7. Remove the mouthpiece from your mouth and breathe out normally. 8. Rest for a few seconds and repeat Steps 1 through 7 at least 10 times every 1-2 hours when you are awake. Take your time and take a few normal breaths between deep breaths. 9. The spirometer may include an indicator to show your best effort. Use the indicator as a goal to work toward during each repetition. 10. After each set of 10 deep  breaths, practice coughing to be sure your lungs are clear. If you have an incision (the cut made at the time of surgery), support your incision when coughing by placing a pillow or rolled up towels firmly against it. Once you are able to get out of bed, walk around indoors and cough well. You may stop using the incentive spirometer when instructed by your caregiver.  RISKS AND COMPLICATIONS  Take your time so you do not get dizzy or light-headed.  If you are in pain, you may need to take or ask for pain medication before doing incentive spirometry. It is harder to take a deep breath if you are having pain. AFTER USE  Rest and breathe slowly and easily.  It can be helpful to keep track of a log of your progress. Your caregiver can provide you with a simple table to help with this. If you are using the spirometer at home, follow these instructions: [place] IF:   You are having difficultly using the spirometer.  You have trouble using the spirometer as often as instructed.  Your pain medication is not giving enough relief while using the spirometer.  You develop fever of 100.5 F (38.1 C) or higher. SEEK IMMEDIATE MEDICAL CARE IF:   You cough up bloody sputum that had not been present before.  You develop fever of 102 F (38.9 C) or greater.  You develop worsening pain at or near the incision site. MAKE SURE YOU:   Understand these instructions.  Will watch your condition.  Will get help right away if you are not doing well or get worse. Document Released: 11/21/2006 Document Revised: 10/03/2011 Document Reviewed: 01/22/2007 [facility] Patient Information 2014 [place], [place].   ________________________________________________________________________ _____________________________________________________________________

## 2019-11-20 ENCOUNTER — Encounter (HOSPITAL_COMMUNITY)
Admission: RE | Admit: 2019-11-20 | Discharge: 2019-11-20 | Disposition: A | Discharge status: Home / Self Care | Payer: PPO | Source: Ambulatory Visit | Attending: Orthopedic Surgery | Admitting: Orthopedic Surgery

## 2019-11-20 ENCOUNTER — Telehealth: Payer: Self-pay

## 2019-11-20 ENCOUNTER — Other Ambulatory Visit (HOSPITAL_COMMUNITY): Payer: PPO

## 2019-11-20 NOTE — Telephone Encounter (Signed)
Spoke to [employer], pt's caregiver, who called to cancel pt's upcoming procedure. She said they will call back to r/s at a later date due to pt needing to have something done with another doctor, per [employer].

## 2019-11-21 ENCOUNTER — Other Ambulatory Visit (HOSPITAL_COMMUNITY): Payer: PPO

## 2019-11-21 ENCOUNTER — Encounter (HOSPITAL_COMMUNITY): Admission: RE | Admit: 2019-11-21 | Payer: PPO | Source: Ambulatory Visit

## 2019-11-22 ENCOUNTER — Other Ambulatory Visit (HOSPITAL_COMMUNITY): Payer: PPO

## 2019-11-25 ENCOUNTER — Encounter (HOSPITAL_COMMUNITY): Payer: Self-pay

## 2019-11-25 ENCOUNTER — Ambulatory Visit (HOSPITAL_COMMUNITY): Admit: 2019-11-25 | Payer: PPO | Admitting: Vascular Surgery

## 2019-11-25 SURGERY — IVC FILTER INSERTION
Anesthesia: LOCAL

## 2019-11-26 ENCOUNTER — Other Ambulatory Visit: Payer: Self-pay | Admitting: Psychiatry

## 2019-11-26 NOTE — Telephone Encounter (Signed)
Last apt was 02/2019 nothing scheduled

## 2019-11-26 NOTE — Telephone Encounter (Signed)
Call to RS

## 2019-11-28 ENCOUNTER — Inpatient Hospital Stay (HOSPITAL_COMMUNITY): Admission: RE | Admit: 2019-11-28 | Payer: PPO | Source: Home / Self Care | Admitting: Orthopedic Surgery

## 2019-11-28 ENCOUNTER — Encounter (HOSPITAL_COMMUNITY): Admission: RE | Payer: Self-pay | Source: Home / Self Care

## 2019-11-28 SURGERY — ARTHROPLASTY, HIP, TOTAL, ANTERIOR APPROACH
Anesthesia: Spinal | Site: Hip | Laterality: Left

## 2019-12-02 ENCOUNTER — Other Ambulatory Visit: Payer: Self-pay

## 2019-12-09 ENCOUNTER — Encounter: Payer: Self-pay | Admitting: Obstetrics & Gynecology

## 2019-12-09 ENCOUNTER — Other Ambulatory Visit: Payer: Self-pay

## 2019-12-09 ENCOUNTER — Ambulatory Visit: Payer: PPO | Admitting: Obstetrics & Gynecology

## 2019-12-09 VITALS — BP 128/72 | HR 80 | Temp 96.6°F | Ht 65.5 in

## 2019-12-09 DIAGNOSIS — N907 Vulvar cyst: Secondary | ICD-10-CM

## 2019-12-09 NOTE — Progress Notes (Signed)
79 y.o. [id number] Widowed White or Caucasian female here as a new patient referred from Dr. Ernestene Kiel for vulvar cysts.  Pt has left hip replacement planned with Dr. Rod Can at [facility].  Surgery was scheduled for May 6th.  She reported to him the presence of vulvar cysts and he recommended she have evaluation for this prior to her surgery.  She was referred from Dr. Laqueta Due, IM, in [place].  Pt does report several years ago having a vulvar area that became tender, ruptured with a little bleeding and drained.  It healed completely.  She's had no issues like this since that time.    Pt denies vaginal bleeding or vaginal discharge.  H/O TVH years ago.    No LMP recorded (lmp unknown). Patient has had a hysterectomy.          Sexually active: No.  The current method of family planning is status post hysterectomy.    Exercising: No.  The patient does not participate in regular exercise at present. Smoker:  no  Health Maintenance: Pap:  Years ago History of abnormal Pap:  no MMG:  About 2 years ago per patient Colonoscopy:  2020 per patient -- done with [facility] BMD:   2021 done with [facility] -- per patient has Osteoporosis and is on Prolia TDaP:  unsure Pneumonia vaccine(s):  none Shingrix:   none Hep C testing: n/a Screening Labs: PCP   reports that she has never smoked. She has never used smokeless tobacco. She reports previous alcohol use. She reports that she does not use drugs.  Past Medical History:  Diagnosis Date  . Anxiety   . Asthma   . Clotting disorder ([place])   . Depression   . Fibromyalgia   . GERD (gastroesophageal reflux disease)   . Hyperlipidemia   . Hypertension   . Migraines   . Osteoporosis   . Ovarian cyst   . Urinary tract infection     Past Surgical History:  Procedure Laterality Date  . APPENDECTOMY    . BACK SURGERY    . femur Right   . gallbladder    . HYSTERECTOMY ABDOMINAL WITH SALPINGECTOMY    . MEDIAL PARTIAL  KNEE REPLACEMENT    . NECK SURGERY    . PARTIAL HIP ARTHROPLASTY Right   . TONSILLECTOMY AND ADENOIDECTOMY     Pt. stated,  . wristset Right     Current Outpatient Medications  Medication Sig Dispense Refill  . albuterol (VENTOLIN HFA) 108 (90 Base) MCG/ACT inhaler Inhale 2 puffs into the lungs every 4 (four) hours as needed for wheezing or shortness of breath.     Marland Kitchen amLODipine (NORVASC) 5 MG tablet Take 5 mg by mouth daily.  11  . apixaban (ELIQUIS) 2.5 MG TABS tablet Take 2.5 mg by mouth 2 (two) times daily.     . BELSOMRA 15 MG TABS TAKE ONE TABLET BY MOUTH EVERY DAY AT BEDTIME (Patient taking differently: Take 15 mg by mouth at bedtime. ) 30 tablet 5  . budesonide-formoterol (SYMBICORT) 160-4.5 MCG/ACT inhaler Inhale 1 puff into the lungs daily.     Marland Kitchen denosumab (PROLIA) 60 MG/ML SOSY injection Inject 60 mg into the skin every 6 (six) months.    . famotidine (PEPCID) 40 MG tablet Take 40 mg by mouth at bedtime.     . furosemide (LASIX) 20 MG tablet Take 20 mg by mouth daily.    Marland Kitchen gabapentin (NEURONTIN) 100 MG capsule Take 100 mg by mouth at bedtime.    Marland Kitchen  HYDROcodone-acetaminophen (NORCO/VICODIN) 5-325 MG tablet Take 1 tablet by mouth every 6 (six) hours as needed for pain.    Marland Kitchen ibuprofen (ADVIL) 200 MG tablet Take 400 mg by mouth every 6 (six) hours as needed for headache or moderate pain.    Marland Kitchen LORazepam (ATIVAN) 0.5 MG tablet TAKE ONE TABLET BY MOUTH 4 TIMES DAILY AS NEEDED (MAX 4 TABLETS DAILY) (Patient taking differently: Take 1.5-2 mg by mouth at bedtime. ) 120 tablet 5  . omeprazole (PRILOSEC) 40 MG capsule Take 40 mg by mouth daily.    . pravastatin (PRAVACHOL) 40 MG tablet Take 40 mg by mouth daily.  6  . QUEtiapine (SEROQUEL) 200 MG tablet TAKE 2 TABLETS BY MOUTH EVERY DAY AT BEDTIME 60 tablet 0  . telmisartan (MICARDIS) 80 MG tablet Take 40 mg by mouth daily.    Marland Kitchen venlafaxine XR (EFFEXOR-XR) 75 MG 24 hr capsule TAKE ONE CAPSULE (7G MG TOTAL) BY MOUTH DAILY WITH BREAKFAST  (Patient taking differently: Take 75 mg by mouth daily with breakfast. ) 30 capsule 5   No current facility-administered medications for this visit.    Family History  Problem Relation Age of Onset  . Heart Problems Mother   . Diabetes type II Father   . Mental retardation Son     Review of Systems  Genitourinary:       Vaginal cyst  All other systems reviewed and are negative.   Exam:   BP 128/72 (BP Location: Right Wrist, Patient Position: Sitting, Cuff Size: Normal)   Pulse 80   Temp (!) 96.6 F (35.9 C) (Temporal)   Ht 5' 5.5" (1.664 m)   LMP  (LMP Unknown)   BMI 19.67 kg/m   Height: 5' 5.5" (166.4 cm)  General appearance: alert, cooperative and appears stated age Head: Normocephalic, without obvious abnormality, atraumatic Lymph nodes: Cervical, supraclavicular, and axillary nodes normal. No abnormal inguinal nodes palpated Neurologic: Grossly normal  Pelvic: External genitalia:  Multiple small sebaceous cysts on both external labia majora, no evidence of infection, non tender              Urethra:  normal appearing urethra with no masses, tenderness or lesions              Bartholins and Skenes: normal                 Vagina: normal appearing vagina with normal color and discharge, no lesions              Cervix: absent              Pap taken: No. Bimanual Exam:  Uterus:  uterus absent              Adnexa: no mass, fullness, tenderness  Chaperone, Karmen Bongo, RN, was present for exam.  A:  Vulvar sebaceous cysts No evidence of vulvar infection H/O PE H/O TVH  P:   Do not recommend any treatment at this time.  Signs/symptoms of infection d/w pt and her daughter (who accompanied her today).  As there is no evidence of infection, would not recommend removal of any of these lesions.  Will send not to Dr. Lyla Glassing regarding my opinion about proceeding with surgery

## 2019-12-10 ENCOUNTER — Telehealth: Payer: Self-pay

## 2019-12-10 NOTE — Telephone Encounter (Signed)
Spoke to both [place] (Dr. Sid Falcon scheduler) and pt's daughter Almyra Free regarding r/s pt's surgery for IVC filter to be placed on 12/30/19. Pre-surgery instructions discussed. Covid screening appt has been made.Almyra Free is aware NPO after midnight and to hold pt's Eliquis x 3 days. They will call with any questions/concerns.

## 2019-12-13 ENCOUNTER — Ambulatory Visit: Payer: Self-pay | Admitting: Orthopedic Surgery

## 2019-12-24 ENCOUNTER — Ambulatory Visit: Payer: Self-pay | Admitting: Orthopedic Surgery

## 2019-12-24 NOTE — Patient Instructions (Addendum)
DUE TO COVID-19 ONLY ONE VISITOR IS ALLOWED TO COME WITH YOU AND STAY IN THE WAITING ROOM ONLY DURING PRE OP AND PROCEDURE DAY OF SURGERY. THE 2 VISITORS  MAY VISIT WITH YOU AFTER SURGERY IN YOUR PRIVATE ROOM DURING VISITING HOURS ONLY!  YOU NEED TO HAVE A COVID 19 TEST ON__6/7_____ @__9 :55_____, THIS TEST MUST BE DONE BEFORE SURGERY, COME  [place] , [address].  ([place]) ONCE YOUR COVID TEST IS COMPLETED, PLEASE BEGIN THE QUARANTINE INSTRUCTIONS AS OUTLINED IN YOUR HANDOUT.                Gordy Levan Cox Muecke    Your procedure is scheduled on: 01/02/20   Report to [facility] Main  Entrance   Report to admitting at 8:30 AM     Call this number if you have problems the morning of surgery [phone]   . BRUSH YOUR TEETH MORNING OF SURGERY AND RINSE YOUR MOUTH OUT, NO CHEWING GUM [place].   Do not eat food After Midnight  . YOU MAY HAVE CLEAR LIQUIDS FROM MIDNIGHT UNTIL 8:00 AM  . At 8:00 AM Please finish the prescribed Pre-Surgery  drink.   Nothing by mouth after you finish the  drink !   Take these medicines the morning of surgery with A SIP OF WATER: Amlodipine, ,  use your inhalers and bring them with you to the hospital                                 You may not have any metal on your body including hair pins and              piercings  Do not wear jewelry, make-up, lotions, powders or perfumes, deodorant             Do not wear nail polish on your fingernails.             Do not shave  48 hours prior to surgery.            Do not bring valuables to the hospital. [place].  Contacts, dentures or bridgework may not be worn into surgery.                 Please read over the following fact sheets you were given: _____________________________________________________________________             [facility] - Preparing for Surgery  Before surgery, you can play an  important role.  Because skin is not sterile, your skin needs to be as free of germs as possible.  You can reduce the number of germs on your skin by washing with CHG (chlorahexidine gluconate) soap before surgery.  CHG is an antiseptic cleaner which kills germs and bonds with the skin to continue killing germs even after washing. Please DO NOT use if you have an allergy to CHG or antibacterial soaps.  If your skin becomes reddened/irritated stop using the CHG and inform your nurse when you arrive at Short Stay. Do not shave (including legs and underarms) for at least 48 hours prior to the first CHG shower.    Please follow these instructions carefully:  1.  Shower with CHG Soap the night before surgery and the  morning of Surgery.  2.  If you choose to wash your hair, wash your hair first as usual with your  normal  shampoo.  3.  After you shampoo, rinse your hair and body thoroughly to remove the  shampoo.                                        4.  Use CHG as you would any other liquid soap.  You can apply chg directly  to the skin and wash                       Gently with a scrungie or clean washcloth.  5.  Apply the CHG Soap to your body ONLY FROM THE NECK DOWN.   Do not use on face/ open                           Wound or open sores. Avoid contact with eyes, ears mouth and genitals (private parts).                       Wash face,  Genitals (private parts) with your normal soap.             6.  Wash thoroughly, paying special attention to the area where your surgery  will be performed.  7.  Thoroughly rinse your body with warm water from the neck down.  8.  DO NOT shower/wash with your normal soap after using and rinsing off  the CHG Soap.                9.  Pat yourself dry with a clean towel.            10.  Wear clean pajamas.            11.  Place clean sheets on your bed the night of your first shower and do not  sleep with pets. Day of Surgery : Do not apply any lotions/deodorants the  morning of surgery.  Please wear clean clothes to the hospital/surgery center.  FAILURE TO FOLLOW THESE INSTRUCTIONS MAY RESULT IN THE CANCELLATION OF YOUR SURGERY PATIENT SIGNATURE_________________________________  NURSE SIGNATURE__________________________________  ________________________________________________________________________   Adam [place]ix  An incentive spirometer is a tool that can help keep your lungs clear and active. This tool measures how well you are filling your lungs with each breath. Taking long deep breaths may help reverse or decrease the chance of developing breathing (pulmonary) problems (especially infection) following:  A long period of time when you are unable to move or be active. BEFORE THE PROCEDURE   If the spirometer includes an indicator to show your best effort, your nurse or respiratory therapist will set it to a desired goal.  If possible, sit up straight or lean slightly forward. Try not to slouch.  Hold the incentive spirometer in an upright position. INSTRUCTIONS FOR USE  1. Sit on the edge of your bed if possible, or sit up as far as you can in bed or on a chair. 2. Hold the incentive spirometer in an upright position. 3. Breathe out normally. 4. Place the mouthpiece in your mouth and seal your lips tightly around it. 5. Breathe in slowly and as deeply as possible, raising the piston or the ball toward the top of the column. 6. Hold your breath for 3-5  seconds or for as long as possible. Allow the piston or ball to fall to the bottom of the column. 7. Remove the mouthpiece from your mouth and breathe out normally. 8. Rest for a few seconds and repeat Steps 1 through 7 at least 10 times every 1-2 hours when you are awake. Take your time and take a few normal breaths between deep breaths. 9. The spirometer may include an indicator to show your best effort. Use the indicator as a goal to work toward during each repetition. 10. After each  set of 10 deep breaths, practice coughing to be sure your lungs are clear. If you have an incision (the cut made at the time of surgery), support your incision when coughing by placing a pillow or rolled up towels firmly against it. Once you are able to get out of bed, walk around indoors and cough well. You may stop using the incentive spirometer when instructed by your caregiver.  RISKS AND COMPLICATIONS  Take your time so you do not get dizzy or light-headed.  If you are in pain, you may need to take or ask for pain medication before doing incentive spirometry. It is harder to take a deep breath if you are having pain. AFTER USE  Rest and breathe slowly and easily.  It can be helpful to keep track of a log of your progress. Your caregiver can provide you with a simple table to help with this. If you are using the spirometer at home, follow these instructions: [place] IF:   You are having difficultly using the spirometer.  You have trouble using the spirometer as often as instructed.  Your pain medication is not giving enough relief while using the spirometer.  You develop fever of 100.5 F (38.1 C) or higher. SEEK IMMEDIATE MEDICAL CARE IF:   You cough up bloody sputum that had not been present before.  You develop fever of 102 F (38.9 C) or greater.  You develop worsening pain at or near the incision site. MAKE SURE YOU:   Understand these instructions.  Will watch your condition.  Will get help right away if you are not doing well or get worse. Document Released: 11/21/2006 Document Revised: 10/03/2011 Document Reviewed: 01/22/2007 [facility] Patient Information 2014 [place], [place].   ________________________________________________________________________

## 2019-12-24 NOTE — H&P (Signed)
TOTAL HIP ADMISSION H&P  Patient is admitted for left total hip arthroplasty.  Subjective:  Chief Complaint: left hip pain  HPI: [name], 79 y.o. female, has a history of pain and functional disability in the left hip(s) due to arthritis and patient has failed non-surgical conservative treatments for greater than 12 weeks to include NSAID's and/or analgesics, flexibility and strengthening excercises, use of assistive devices, weight reduction as appropriate and activity modification.  Onset of symptoms was gradual starting 3 years ago with rapidlly worsening course since that time.The patient noted no past surgery on the left hip(s).  Patient currently rates pain in the left hip at 10 out of 10 with activity. Patient has night pain, worsening of pain with activity and weight bearing, trendelenberg gait, pain that interfers with activities of daily living, pain with passive range of motion and crepitus. Patient has evidence of subchondral cysts, subchondral sclerosis, periarticular osteophytes and joint space narrowing by imaging studies. This condition presents safety issues increasing the risk of falls.There is no current active infection.  Patient Active Problem List   Diagnosis Date Noted  . Idiopathic peripheral neuropathy 06/20/2017  . At high risk for falls 04/01/2015  . Fibromyalgia 04/01/2015  . Senile osteoporosis 04/01/2015  . Status post closed reduction with internal fixation 10/08/2014  . Asthma 09/13/2014  . Chronic anticoagulation 09/13/2014  . COPD (chronic obstructive pulmonary disease) ([place]) 09/13/2014  . 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 Anxiety   . Asthma   . Depression   . Fibromyalgia   . GERD (gastroesophageal reflux disease)   . Hyperlipidemia   . Hypertension   . Migraines   . Osteoporosis   . Ovarian cyst   . Pulmonary emboli ([place])   . Urinary tract infection     Past Surgical History:   Procedure Laterality Date  . APPENDECTOMY    . BACK SURGERY    . CHOLECYSTECTOMY, LAPAROSCOPIC    . FEMUR FRACTURE SURGERY Right   . MEDIAL PARTIAL KNEE REPLACEMENT    . NECK SURGERY    . PARTIAL HIP ARTHROPLASTY Right   . TONSILLECTOMY AND ADENOIDECTOMY     Pt. stated,  . VAGINAL HYSTERECTOMY      Current Outpatient Medications  Medication Sig Dispense Refill Last Dose  . albuterol (VENTOLIN HFA) 108 (90 Base) MCG/ACT inhaler Inhale 2 puffs into the lungs every 4 (four) hours as needed for wheezing or shortness of breath.      Marland Kitchen amLODipine (NORVASC) 5 MG tablet Take 5 mg by mouth daily.  11   . apixaban (ELIQUIS) 2.5 MG TABS tablet Take 2.5 mg by mouth 2 (two) times daily.      . BELSOMRA 15 MG TABS TAKE ONE TABLET BY MOUTH EVERY DAY AT BEDTIME (Patient taking differently: Take 15 mg by mouth at bedtime. ) 30 tablet 5   . budesonide-formoterol (SYMBICORT) 160-4.5 MCG/ACT inhaler Inhale 1 puff into the lungs daily.      Marland Kitchen denosumab (PROLIA) 60 MG/ML SOSY injection Inject 60 mg into the skin every 6 (six) months.     . famotidine (PEPCID) 40 MG tablet Take 40 mg by mouth at bedtime.      . furosemide (LASIX) 20 MG tablet Take 20 mg by mouth daily.     Marland Kitchen gabapentin (NEURONTIN) 100 MG capsule Take 100 mg by mouth at bedtime.     Marland Kitchen HYDROcodone-acetaminophen (NORCO/VICODIN) 5-325 MG tablet Take 1 tablet by mouth  every 6 (six) hours as needed for pain.     Marland Kitchen ibuprofen (ADVIL) 200 MG tablet Take 400 mg by mouth every 6 (six) hours as needed for headache or moderate pain.     Marland Kitchen LORazepam (ATIVAN) 0.5 MG tablet TAKE ONE TABLET BY MOUTH 4 TIMES DAILY AS NEEDED (MAX 4 TABLETS DAILY) (Patient taking differently: Take 1.5-2 mg by mouth at bedtime. ) 120 tablet 5   . omeprazole (PRILOSEC) 40 MG capsule Take 40 mg by mouth daily.     . pravastatin (PRAVACHOL) 40 MG tablet Take 40 mg by mouth daily.  6   . QUEtiapine (SEROQUEL) 200 MG tablet TAKE 2 TABLETS BY MOUTH EVERY DAY AT BEDTIME (Patient taking  differently: Take 400 mg by mouth at bedtime. ) 60 tablet 0   . telmisartan (MICARDIS) 80 MG tablet Take 40 mg by mouth daily.     Marland Kitchen venlafaxine XR (EFFEXOR-XR) 75 MG 24 hr capsule TAKE ONE CAPSULE (7G MG TOTAL) BY MOUTH DAILY WITH BREAKFAST (Patient not taking: No sig reported) 30 capsule 5    No current facility-administered medications for this visit.   Allergies  Allergen Reactions  . Morphine Nausea And Vomiting  . Sulfa Antibiotics Rash    Social History   Tobacco Use  . Smoking status: Never Smoker  . Smokeless tobacco: Never Used  Substance Use Topics  . Alcohol use: Not Currently    Family History  Problem Relation Age of Onset  . Heart Problems Mother   . Diabetes type II Father   . Mental retardation Son      Review of Systems  Constitutional: Negative.   HENT: Negative.   Eyes: Negative.   Respiratory: Negative.   Cardiovascular: Negative.   Gastrointestinal: Negative.   Endocrine: Negative.   Genitourinary: Negative.   Musculoskeletal: Positive for arthralgias.  Skin: Negative.   Allergic/Immunologic: Negative.   Neurological: Negative.   Hematological: Negative.   Psychiatric/Behavioral: Negative.     Objective:  Physical Exam  Vitals reviewed. Constitutional: She is oriented to person, place, and time. She appears well-developed and well-nourished.  HENT:  Head: Normocephalic and atraumatic.  Eyes: Pupils are equal, round, and reactive to light. Conjunctivae and EOM are normal.  Cardiovascular: Normal rate, regular rhythm and intact distal pulses.  Respiratory: Effort normal. No respiratory distress.  GI: Soft. She exhibits no distension.  Genitourinary:    Genitourinary Comments: deferred   Musculoskeletal:     Cervical back: Normal range of motion and neck supple.     Left hip: Bony tenderness present. Decreased range of motion.  Neurological: She is alert and oriented to person, place, and time. She has normal reflexes.  Skin: Skin is warm  and dry.  Psychiatric: She has a normal mood and affect. Judgment and thought content normal.    Vital signs in last 24 hours: @VSRANGES @  Labs:   Estimated body mass index is 19.67 kg/m as calculated from the following:   Height as of 12/09/19: 5' 5.5" (1.664 m).   Weight as of 05/11/17: 54.4 kg.   Imaging Review Plain radiographs demonstrate severe degenerative joint disease of the left hip(s). The bone quality appears to be adequate for age and reported activity level.      Assessment/Plan:  End stage arthritis, left hip(s)  The patient history, physical examination, clinical judgement of the provider and imaging studies are consistent with end stage degenerative joint disease of the left hip(s) and total hip arthroplasty is deemed medically necessary. The treatment  options including medical management, injection therapy, arthroscopy and arthroplasty were discussed at length. The risks and benefits of total hip arthroplasty were presented and reviewed. The risks due to aseptic loosening, infection, stiffness, dislocation/subluxation,  thromboembolic complications and other imponderables were discussed.  The patient acknowledged the explanation, agreed to proceed with the plan and consent was signed. Patient is being admitted for inpatient treatment for surgery, pain control, PT, OT, prophylactic antibiotics, VTE prophylaxis, progressive ambulation and ADL's and discharge planning.The patient is planning to be discharged home with HEP. Preop IVC filter by Dr. Donzetta Matters    Patient's anticipated LOS is less than 2 midnights, meeting these requirements: - Younger than 62 - Lives within 1 hour of care - Has a competent adult at home to recover with post-op recover - NO history of  - Chronic pain requiring opiods  - Diabetes  - Coronary Artery Disease  - Heart failure  - Heart attack  - Stroke  - DVT/VTE  - Cardiac arrhythmia  - Respiratory Failure/COPD  - Renal failure  -  Anemia  - Advanced Liver disease

## 2019-12-25 ENCOUNTER — Encounter (HOSPITAL_COMMUNITY): Payer: Self-pay

## 2019-12-25 ENCOUNTER — Encounter (HOSPITAL_COMMUNITY)
Admission: RE | Admit: 2019-12-25 | Discharge: 2019-12-25 | Disposition: A | Discharge status: Home / Self Care | Payer: PPO | Source: Ambulatory Visit | Attending: Orthopedic Surgery | Admitting: Orthopedic Surgery

## 2019-12-25 ENCOUNTER — Other Ambulatory Visit: Payer: Self-pay

## 2019-12-25 DIAGNOSIS — Z01812 Encounter for preprocedural laboratory examination: Secondary | ICD-10-CM | POA: Insufficient documentation

## 2019-12-25 NOTE — Progress Notes (Signed)
COVID Vaccine Completed:No Date COVID Vaccine completed: COVID vaccine manufacturer: Pfizer    [employer]   PCP - Dr. Iven Finn Cardiologist - Dr Donzetta Matters- IVP filter placed 12/30/19  Chest x-ray - no EKG - 12/27/19 Stress Test - no ECHO - no Cardiac Cath - no  Sleep Study - no CPAP -   Fasting Blood Sugar - NA Checks Blood Sugar _____ times a day  Blood Thinner Instructions:Eliquis Aspirin Instructions:no instructions received from Dr. Lyla Glassing. The pt will be stopping it for the IVP filter on 6/7 and the daughter will check with her Dr.to see iff the pt should stay off of it for the hip on 6/10. Last Dose:  Anesthesia review:   Patient denies shortness of breath, fever, cough and chest pain at PAT appointment yes  Patient verbalized understanding of instructions that were given to them at the PAT appointment. Patient was also instructed that they will need to review over the PAT instructions again at home before surgery. yes

## 2019-12-27 ENCOUNTER — Encounter (HOSPITAL_COMMUNITY)
Admission: RE | Admit: 2019-12-27 | Discharge: 2019-12-27 | Disposition: A | Discharge status: Home / Self Care | Payer: PPO | Source: Ambulatory Visit | Attending: Orthopedic Surgery | Admitting: Orthopedic Surgery

## 2019-12-27 ENCOUNTER — Other Ambulatory Visit: Payer: Self-pay

## 2019-12-27 ENCOUNTER — Other Ambulatory Visit (HOSPITAL_COMMUNITY)
Admission: RE | Admit: 2019-12-27 | Discharge: 2019-12-27 | Disposition: A | Discharge status: Home / Self Care | Payer: PPO | Source: Ambulatory Visit | Attending: Vascular Surgery | Admitting: Vascular Surgery

## 2019-12-27 ENCOUNTER — Encounter (HOSPITAL_COMMUNITY): Payer: Self-pay | Admitting: Physician Assistant

## 2019-12-27 DIAGNOSIS — Z01818 Encounter for other preprocedural examination: Secondary | ICD-10-CM | POA: Diagnosis not present

## 2019-12-27 DIAGNOSIS — Z20822 Contact with and (suspected) exposure to covid-19: Secondary | ICD-10-CM | POA: Insufficient documentation

## 2019-12-27 LAB — PROTIME-INR
INR: 1.3 — ABNORMAL HIGH (ref 0.8–1.2)
Prothrombin Time: 15.6 seconds — ABNORMAL HIGH (ref 11.4–15.2)

## 2019-12-27 LAB — CBC
HCT: 34 % — ABNORMAL LOW (ref 36.0–46.0)
Hemoglobin: 10 g/dL — ABNORMAL LOW (ref 12.0–15.0)
MCH: 27.5 pg (ref 26.0–34.0)
MCHC: 29.4 g/dL — ABNORMAL LOW (ref 30.0–36.0)
MCV: 93.4 fL (ref 80.0–100.0)
Platelets: 510 10*3/uL — ABNORMAL HIGH (ref 150–400)
RBC: 3.64 MIL/uL — ABNORMAL LOW (ref 3.87–5.11)
RDW: 16.2 % — ABNORMAL HIGH (ref 11.5–15.5)
WBC: 11.8 10*3/uL — ABNORMAL HIGH (ref 4.0–10.5)
nRBC: 0 % (ref 0.0–0.2)

## 2019-12-27 LAB — COMPREHENSIVE METABOLIC PANEL
ALT: 13 U/L (ref 0–44)
AST: 16 U/L (ref 15–41)
Albumin: 2.7 g/dL — ABNORMAL LOW (ref 3.5–5.0)
Alkaline Phosphatase: 88 U/L (ref 38–126)
Anion gap: 12 (ref 5–15)
BUN: 13 mg/dL (ref 8–23)
CO2: 26 mmol/L (ref 22–32)
Calcium: 8.8 mg/dL — ABNORMAL LOW (ref 8.9–10.3)
Chloride: 100 mmol/L (ref 98–111)
Creatinine, Ser: 1.36 mg/dL — ABNORMAL HIGH (ref 0.44–1.00)
GFR calc Af Amer: 42 mL/min — ABNORMAL LOW (ref >60)
GFR calc non Af Amer: 36 mL/min — ABNORMAL LOW (ref >60)
Glucose, Bld: 137 mg/dL — ABNORMAL HIGH (ref 70–99)
Potassium: 4.4 mmol/L (ref 3.5–5.1)
Sodium: 138 mmol/L (ref 135–145)
Total Bilirubin: 0.4 mg/dL (ref 0.3–1.2)
Total Protein: 6.8 g/dL (ref 6.5–8.1)

## 2019-12-27 LAB — SURGICAL PCR SCREEN
MRSA, PCR: POSITIVE — AB
Staphylococcus aureus: POSITIVE — AB

## 2019-12-27 NOTE — Progress Notes (Signed)
Pt was not able to stand and pivot to get on the commode. The rest room is too small at PAT. UA will have to be DOS.

## 2019-12-28 ENCOUNTER — Other Ambulatory Visit (HOSPITAL_COMMUNITY): Payer: PPO

## 2019-12-28 LAB — SARS CORONAVIRUS 2 (TAT 6-24 HRS): SARS Coronavirus 2: NEGATIVE

## 2019-12-30 ENCOUNTER — Other Ambulatory Visit (HOSPITAL_COMMUNITY)
Admission: RE | Admit: 2019-12-30 | Discharge: 2019-12-30 | Disposition: A | Discharge status: Home / Self Care | Payer: PPO | Source: Ambulatory Visit | Attending: Vascular Surgery | Admitting: Vascular Surgery

## 2019-12-30 ENCOUNTER — Other Ambulatory Visit: Payer: Self-pay

## 2019-12-30 ENCOUNTER — Encounter (HOSPITAL_COMMUNITY)
Admission: RE | Disposition: A | Discharge status: Home / Self Care | Payer: Self-pay | Source: Ambulatory Visit | Attending: Vascular Surgery

## 2019-12-30 ENCOUNTER — Ambulatory Visit (HOSPITAL_COMMUNITY)
Admission: RE | Admit: 2019-12-30 | Discharge: 2019-12-30 | Disposition: A | Discharge status: Home / Self Care | Payer: PPO | Source: Ambulatory Visit | Attending: Vascular Surgery | Admitting: Vascular Surgery

## 2019-12-30 DIAGNOSIS — Z86718 Personal history of other venous thrombosis and embolism: Secondary | ICD-10-CM | POA: Insufficient documentation

## 2019-12-30 DIAGNOSIS — Z7951 Long term (current) use of inhaled steroids: Secondary | ICD-10-CM | POA: Insufficient documentation

## 2019-12-30 DIAGNOSIS — E785 Hyperlipidemia, unspecified: Secondary | ICD-10-CM | POA: Insufficient documentation

## 2019-12-30 DIAGNOSIS — K219 Gastro-esophageal reflux disease without esophagitis: Secondary | ICD-10-CM | POA: Diagnosis not present

## 2019-12-30 DIAGNOSIS — Z79899 Other long term (current) drug therapy: Secondary | ICD-10-CM | POA: Insufficient documentation

## 2019-12-30 DIAGNOSIS — Z20822 Contact with and (suspected) exposure to covid-19: Secondary | ICD-10-CM | POA: Diagnosis not present

## 2019-12-30 DIAGNOSIS — Z882 Allergy status to sulfonamides status: Secondary | ICD-10-CM | POA: Diagnosis not present

## 2019-12-30 DIAGNOSIS — M797 Fibromyalgia: Secondary | ICD-10-CM | POA: Diagnosis not present

## 2019-12-30 DIAGNOSIS — Z7901 Long term (current) use of anticoagulants: Secondary | ICD-10-CM | POA: Diagnosis not present

## 2019-12-30 DIAGNOSIS — J45909 Unspecified asthma, uncomplicated: Secondary | ICD-10-CM | POA: Insufficient documentation

## 2019-12-30 DIAGNOSIS — I10 Essential (primary) hypertension: Secondary | ICD-10-CM | POA: Diagnosis not present

## 2019-12-30 DIAGNOSIS — F329 Major depressive disorder, single episode, unspecified: Secondary | ICD-10-CM | POA: Insufficient documentation

## 2019-12-30 DIAGNOSIS — M810 Age-related osteoporosis without current pathological fracture: Secondary | ICD-10-CM | POA: Diagnosis not present

## 2019-12-30 DIAGNOSIS — F419 Anxiety disorder, unspecified: Secondary | ICD-10-CM | POA: Insufficient documentation

## 2019-12-30 DIAGNOSIS — Z885 Allergy status to narcotic agent status: Secondary | ICD-10-CM | POA: Diagnosis not present

## 2019-12-30 DIAGNOSIS — M25552 Pain in left hip: Secondary | ICD-10-CM | POA: Diagnosis not present

## 2019-12-30 HISTORY — PX: IVC FILTER INSERTION: CATH118245

## 2019-12-30 LAB — SARS CORONAVIRUS 2 (TAT 6-24 HRS): SARS Coronavirus 2: NEGATIVE

## 2019-12-30 SURGERY — IVC FILTER INSERTION
Anesthesia: LOCAL

## 2019-12-30 MED ORDER — SODIUM CHLORIDE 0.9 % IV SOLN: 250.0000 mL | INTRAVENOUS | Status: DC | PRN

## 2019-12-30 MED ORDER — LIDOCAINE HCL (PF) 1 % IJ SOLN
INTRAMUSCULAR | Status: DC | PRN
  Administered 2019-12-30: 15 mL via INTRADERMAL

## 2019-12-30 MED ORDER — IODIXANOL 320 MG/ML IV SOLN
INTRAVENOUS | Status: DC | PRN
  Administered 2019-12-30: 20 mL via INTRAVENOUS

## 2019-12-30 MED ORDER — HEPARIN (PORCINE) IN NACL 1000-0.9 UT/500ML-% IV SOLN
INTRAVENOUS | Status: AC
  Filled 2019-12-30: qty 500

## 2019-12-30 MED ORDER — METOPROLOL TARTRATE 5 MG/5ML IV SOLN: 10.0000 mg | Freq: Once | INTRAVENOUS | Status: DC

## 2019-12-30 MED ORDER — ONDANSETRON HCL 4 MG/2ML IJ SOLN: 4.0000 mg | Freq: Four times a day (QID) | INTRAMUSCULAR | Status: DC | PRN

## 2019-12-30 MED ORDER — LIDOCAINE HCL (PF) 1 % IJ SOLN
INTRAMUSCULAR | Status: AC
  Filled 2019-12-30: qty 30

## 2019-12-30 MED ORDER — HEPARIN (PORCINE) IN NACL 1000-0.9 UT/500ML-% IV SOLN
INTRAVENOUS | Status: DC | PRN
  Administered 2019-12-30: 500 mL

## 2019-12-30 MED ORDER — SODIUM CHLORIDE 0.9% FLUSH: 3.0000 mL | Freq: Two times a day (BID) | INTRAVENOUS | Status: DC

## 2019-12-30 MED ORDER — HYDRALAZINE HCL 20 MG/ML IJ SOLN: 5.0000 mg | INTRAMUSCULAR | Status: DC | PRN

## 2019-12-30 MED ORDER — ACETAMINOPHEN 325 MG PO TABS: 650.0000 mg | ORAL_TABLET | ORAL | Status: DC | PRN

## 2019-12-30 MED ORDER — SODIUM CHLORIDE 0.9% FLUSH: 3.0000 mL | INTRAVENOUS | Status: DC | PRN

## 2019-12-30 MED ORDER — LABETALOL HCL 5 MG/ML IV SOLN
10.0000 mg | INTRAVENOUS | Status: DC | PRN
  Filled 2019-12-30: qty 4

## 2019-12-30 MED ORDER — SODIUM CHLORIDE 0.9 % IV SOLN: INTRAVENOUS | Status: DC

## 2019-12-30 SURGICAL SUPPLY — 8 items
FILTER VC CELECT-FEMORAL (Filter) ×1 IMPLANT
KIT PV (KITS) ×2 IMPLANT
SHEATH PROBE COVER 6X72 (BAG) ×1 IMPLANT
SYR MEDRAD MARK V 150ML (SYRINGE) ×1 IMPLANT
TRANSDUCER W/STOPCOCK (MISCELLANEOUS) ×2 IMPLANT
TRAY PV CATH (CUSTOM PROCEDURE TRAY) ×2 IMPLANT
TUBE CONN 8.8X1320 FR HP M-F (CONNECTOR) ×1 IMPLANT
WIRE BENTSON .035X145CM (WIRE) ×1 IMPLANT

## 2019-12-30 NOTE — Op Note (Signed)
    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 Donzetta Matters, MD Procedure Performed: 1.  Ultrasound-guided cannulation of right common femoral vein 2.  Placement of infrarenal Cook Celect IVC filter  Indications: 79 year old female with history of DVT currently on Eliquis.  She is planned for left hip replacement.  She is indicated for filter placement prior.  Findings: IVC was patent less than 28 mm.  Filter was placed with help to the level of the left renal vein which appeared to be the lowest.   Procedure:  The patient was identified in the holding area and taken to room 8.  The patient was then placed supine on the table and prepped and draped in the usual sterile fashion.  A time out was called.  Ultrasound was used to evaluate the right common femoral vein which was noted be patent and compressible.  As cannulating gauge needle a wire was placed centrally under fluoroscopic and ultrasound guidance.  Introducer was was placed IVC venogram was performed.  We performed this on 2 separate areas then marked the level of our vena cava confluence as well as renal veins.  The filter was brought with a hook to the level of the left renal vein this was deployed.  Sheath was removed pressure held.  Patient tolerated procedure without immediate complication.  Contrast: 20 cc     C. Donzetta Matters, MD [facility] Office: [phone] Pager: [phone]

## 2019-12-30 NOTE — Progress Notes (Addendum)
Pt asymptomatic to tachycardia 155bpm, Dr Donzetta Matters was called, order followed for IV metoprolol 10 mg 1223 HR back to 87, medication held at this time.

## 2019-12-30 NOTE — Discharge Instructions (Signed)
venogram, Care After This sheet gives you information about how to care for yourself after your procedure. Your health care provider may also give you more specific instructions. If you have problems or questions, contact your health care provider. What can I expect after the procedure? After the procedure, it is common to have bruising and tenderness at the catheter insertion area. Follow these instructions at home: Insertion site care  Follow instructions from your health care provider about how to take care of your insertion site. Make sure you: ? Wash your hands with soap and water before you change your bandage (dressing). If soap and water are not available, use hand sanitizer. ? Change your dressing as told by your health care provider. ? Leave stitches (sutures), skin glue, or adhesive strips in place. These skin closures may need to stay in place for 2 weeks or longer. If adhesive strip edges start to loosen and curl up, you may trim the loose edges. Do not remove adhesive strips completely unless your health care provider tells you to do that.  Do not take baths, swim, or use a hot tub until your health care provider approves.  You may shower 24-48 hours after the procedure or as told by your health care provider. ? Gently wash the site with plain soap and water. ? Pat the area dry with a clean towel. ? Do not rub the site. This may cause bleeding.  Do not apply powder or lotion to the site. Keep the site clean and dry.  Check your insertion site every day for signs of infection. Check for: ? Redness, swelling, or pain. ? Fluid or blood. ? Warmth. ? Pus or a bad smell. Activity  Rest as told by your health care provider, usually for 1-2 days.  Do not lift anything that is heavier than 10 lbs. (4.5 kg) or as told by your health care provider.  Do not drive for 24 hours if you were given a medicine to help you relax (sedative).  Do not drive or use heavy machinery while  taking prescription pain medicine. General instructions   Return to your normal activities as told by your health care provider, usually in about a week. Ask your health care provider what activities are safe for you.  If the catheter site starts bleeding, lie flat and put pressure on the site. If the bleeding does not stop, get help right away. This is a medical emergency.  Drink enough fluid to keep your urine clear or pale yellow. This helps flush the contrast dye from your body.  Take over-the-counter and prescription medicines only as told by your health care provider.  Keep all follow-up visits as told by your health care provider. This is important. Contact a health care provider if:  You have a fever or chills.  You have redness, swelling, or pain around your insertion site.  You have fluid or blood coming from your insertion site.  The insertion site feels warm to the touch.  You have pus or a bad smell coming from your insertion site.  You have bruising around the insertion site.  You notice blood collecting in the tissue around the catheter site (hematoma). The hematoma may be painful to the touch. Get help right away if:  You have severe pain at the catheter insertion area.  The catheter insertion area swells very fast.  The catheter insertion area is bleeding, and the bleeding does not stop when you hold steady pressure on the area.  The area near or just beyond the catheter insertion site becomes pale, cool, tingly, or numb. These symptoms may represent a serious problem that is an emergency. Do not wait to see if the symptoms will go away. Get medical help right away. Call your local emergency services (911 in the [place] Do not drive yourself to the hospital. Summary  After the procedure, it is common to have bruising and tenderness at the catheter insertion area.  After the procedure, it is important to rest and drink plenty of fluids.  Do not take baths,  swim, or use a hot tub until your health care provider says it is okay to do so. You may shower 24-48 hours after the procedure or as told by your health care provider.  If the catheter site starts bleeding, lie flat and put pressure on the site. If the bleeding does not stop, get help right away. This is a medical emergency. This information is not intended to replace advice given to you by your health care provider. Make sure you discuss any questions you have with your health care provider. Document Revised: 06/23/2017 Document Reviewed: 06/15/2016 Elsevier Patient Education  2020 Elsevier Inc.  

## 2019-12-30 NOTE — H&P (Signed)
H+P History of Present Illness: This is a 79 y.o. female history of DVT.  She is now planned for hip replacement.  We will plan for IVC filter placement and removed within 3 months.  Past Medical History:  Diagnosis Date  . Anemia 2020  . Anxiety   . Asthma   . Depression   . Fibromyalgia   . GERD (gastroesophageal reflux disease)   . Hyperlipidemia   . Hypertension   . Migraines   . Osteoporosis   . Ovarian cyst   . Pulmonary emboli ([place])   . Urinary tract infection     Past Surgical History:  Procedure Laterality Date  . APPENDECTOMY    . BACK SURGERY    . CHOLECYSTECTOMY    . CHOLECYSTECTOMY, LAPAROSCOPIC    . FEMUR FRACTURE SURGERY Right   . MEDIAL PARTIAL KNEE REPLACEMENT    . NECK SURGERY    . PARTIAL HIP ARTHROPLASTY Right   . TONSILLECTOMY AND ADENOIDECTOMY     Pt. stated,  . VAGINAL HYSTERECTOMY      Allergies  Allergen Reactions  . Morphine Nausea And Vomiting  . Sulfa Antibiotics Rash    Prior to Admission medications   Medication Sig Start Date End Date Taking? Authorizing Provider  albuterol (VENTOLIN HFA) 108 (90 Base) MCG/ACT inhaler Inhale 2 puffs into the lungs every 4 (four) hours as needed for wheezing or shortness of breath.  11/05/18  Yes [provider]  amLODipine (NORVASC) 5 MG tablet Take 5 mg by mouth daily. 04/12/17  Yes [provider]  apixaban (ELIQUIS) 2.5 MG TABS tablet Take 2.5 mg by mouth 2 (two) times daily.  01/29/19  Yes [provider]  BELSOMRA 15 MG TABS TAKE ONE TABLET BY MOUTH EVERY DAY AT BEDTIME Patient taking differently: Take 15 mg by mouth at bedtime.  09/27/19  Yes Cottle, Billey Co., MD  budesonide-formoterol [facility]) 160-4.5 MCG/ACT inhaler Inhale 1 puff into the lungs daily.    Yes [provider]  famotidine (PEPCID) 40 MG tablet Take 40 mg by mouth at bedtime.  12/31/18  Yes [provider]  furosemide (LASIX) 20 MG tablet Take 20 mg by mouth daily. As needed   Yes  [provider]  gabapentin (NEURONTIN) 100 MG capsule Take 100 mg by mouth at bedtime. 02/28/19  Yes [provider]  HYDROcodone-acetaminophen (NORCO/VICODIN) 5-325 MG tablet Take 1 tablet by mouth every 6 (six) hours as needed for pain. 11/01/19  Yes [provider]  ibuprofen (ADVIL) 200 MG tablet Take 400 mg by mouth every 6 (six) hours as needed for headache or moderate pain.   Yes [provider]  LORazepam (ATIVAN) 0.5 MG tablet TAKE ONE TABLET BY MOUTH 4 TIMES DAILY AS NEEDED (MAX 4 TABLETS DAILY) Patient taking differently: Take 1.5-2 mg by mouth at bedtime.  09/27/19  Yes Cottle, Billey Co., MD  omeprazole (PRILOSEC) 40 MG capsule Take 40 mg by mouth daily. 10/29/19  Yes [provider]  pravastatin (PRAVACHOL) 40 MG tablet Take 40 mg by mouth daily. 04/12/17  Yes [provider]  QUEtiapine (SEROQUEL) 200 MG tablet TAKE 2 TABLETS BY MOUTH EVERY DAY AT BEDTIME Patient taking differently: Take 400 mg by mouth at bedtime.  12/02/19  Yes Cottle, Billey Co., MD  telmisartan (MICARDIS) 80 MG tablet Take 40 mg by mouth daily. 10/01/19  Yes [provider]  denosumab (PROLIA) 60 MG/ML SOSY injection Inject 60 mg into the skin every 6 (six) months.  [provider]  venlafaxine XR (EFFEXOR-XR) 75 MG 24 hr capsule TAKE ONE CAPSULE (7G MG TOTAL) BY MOUTH DAILY WITH BREAKFAST Patient taking differently: Take 50 mg by mouth at bedtime.  04/29/19   Cottle, Billey Co., MD    Social History   Socioeconomic History  . Marital status: Widowed    Spouse name: Not on file  . Number of children: Not on file  . Years of education: Not on file  . Highest education level: Not on file  Occupational History  . Not on file  Tobacco Use  . Smoking status: Never Smoker  . Smokeless tobacco: Never Used  Substance and Sexual Activity  . Alcohol use: Not Currently  . Drug use: Never  . Sexual activity: Not Currently    Birth  control/protection: Surgical    Comment: Hysterectomy  Other Topics Concern  . Not on file  Social History Narrative  . Not on file   Social Determinants of Health   Financial Resource Strain:   . Difficulty of Paying Living Expenses:   Food Insecurity:   . Worried About Charity fundraiser in the Last Year:   . Arboriculturist in the Last Year:   Transportation Needs:   . Film/video editor (Medical):   Marland Kitchen Lack of Transportation (Non-Medical):   Physical Activity:   . Days of Exercise per Week:   . Minutes of Exercise per Session:   Stress:   . Feeling of Stress :   Social Connections:   . Frequency of Communication with Friends and Family:   . Frequency of Social Gatherings with Friends and Family:   . Attends Religious Services:   . Active Member of Clubs or Organizations:   . Attends Archivist Meetings:   Marland Kitchen Marital Status:   Intimate Partner Violence:   . Fear of Current or Ex-Partner:   . Emotionally Abused:   Marland Kitchen Physically Abused:   . Sexually Abused:      Family History  Problem Relation Age of Onset  . Heart Problems Mother   . Diabetes type II Father   . Mental retardation Son     ROS: She has no complaints today   Physical Examination  Vitals:   12/30/19 1007  BP: (!) 146/61  Pulse: (!) 101  Resp: 14  Temp: (!) 97.4 F (36.3 C)  SpO2: 95%   Body mass index is 27.04 kg/m. Awake alert oriented x3 Nonlabored respirations Abdomen is soft  CBC    Component Value Date/Time   WBC 11.8 (H) 12/27/2019 1219   RBC 3.64 (L) 12/27/2019 1219   HGB 10.0 (L) 12/27/2019 1219   HCT 34.0 (L) 12/27/2019 1219   PLT 510 (H) 12/27/2019 1219   MCV 93.4 12/27/2019 1219   MCH 27.5 12/27/2019 1219   MCHC 29.4 (L) 12/27/2019 1219   RDW 16.2 (H) 12/27/2019 1219   LYMPHSABS 2.4 10/22/2007 1016   MONOABS 0.6 10/22/2007 1016   EOSABS 0.3 10/22/2007 1016   BASOSABS 0.0 10/22/2007 1016    BMET    Component Value Date/Time   NA 138 12/27/2019  1219   K 4.4 12/27/2019 1219   CL 100 12/27/2019 1219   CO2 26 12/27/2019 1219   GLUCOSE 137 (H) 12/27/2019 1219   BUN 13 12/27/2019 1219   CREATININE 1.36 (H) 12/27/2019 1219   CALCIUM 8.8 (L) 12/27/2019 1219   GFRNONAA 36 (L) 12/27/2019 1219   GFRAA 42 (L) 12/27/2019 1219  COAGS: Lab Results  Component Value Date   INR 1.3 (H) 12/27/2019   INR 0.9 10/22/2007      ASSESSMENT/PLAN: This is a 79 y.o. female with history of DVT.  She is now planned for left hip replacement we will plan for IVC filter placement and to remove within 3 months.   C. Donzetta Matters, MD [facility] [place] Office: [phone] Pager: [phone]

## 2019-12-31 ENCOUNTER — Telehealth: Payer: Self-pay

## 2019-12-31 ENCOUNTER — Encounter (HOSPITAL_COMMUNITY): Payer: Self-pay | Admitting: Physician Assistant

## 2019-12-31 NOTE — Telephone Encounter (Signed)
Spoke with Pts dtr Almyra Free regarding questions about the AVS post procedure on yesterday advising to speak with doctor about how to take Effexor. Per medication note "Pt was switched to 150 mg but has not taken that either because she feels, its too much". Advised to contact prescribing MD to discuss side effects and for further recommendations. Dtr voiced understanding. Minette Brine, RN

## 2019-12-31 NOTE — Progress Notes (Signed)
Anesthesia Chart Review   Case: [id number] Date/Time: 01/02/20 1045   Procedure: TOTAL HIP ARTHROPLASTY ANTERIOR APPROACH (Left Hip)   Anesthesia type: Spinal   Pre-op diagnosis: Degenerative joint disease left hip   Location: [facility] ROOM 08 / [facility]   Surgeons: Rod Can, MD      DISCUSSION:79 y.o. never smoker with h/o HTN, HLD, asthma, GERD, PE, djd left hip scheduled for above procedure 01/02/2020 with Dr. Rod Can.   Preop IV filter placed by Dr. Servando Snare 12/30/2019.  Pt held Eliquis 3 days prior to procedure.    Clearance received from PCP, Dr. Laqueta Due, 10/16/2019 which states pt is medically cleared for surgery.   Anticipate pt can proceed with planned procedure barring acute status change.   VS: LMP  (LMP Unknown)   PROVIDERS: Ernestene Kiel, MD is PCP    LABS: Labs reviewed: Acceptable for surgery. (all labs ordered are listed, but only abnormal results are displayed)  Labs Reviewed - No data to display   IMAGES:   EKG: 12/27/2019 Rate 100 bpm Regular rhythm, probably ectopic atrial Left axis deviation Possible Anterolateral infarct , age undetermined Poor anterior R wave progression When compared with ECG of 10/23/07 P wave axis has shifted, poor anterior R wave progression is new  CV:  Past Medical History:  Diagnosis Date   Anemia 2020   Anxiety    Asthma    Depression    Fibromyalgia    GERD (gastroesophageal reflux disease)    Hyperlipidemia    Hypertension    Migraines    Osteoporosis    Ovarian cyst    Pulmonary emboli (HCC)    Urinary tract infection     Past Surgical History:  Procedure Laterality Date   APPENDECTOMY     BACK SURGERY     CHOLECYSTECTOMY     CHOLECYSTECTOMY, LAPAROSCOPIC     FEMUR FRACTURE SURGERY Right    IVC FILTER INSERTION N/A 12/30/2019   Procedure: IVC FILTER INSERTION;  Surgeon: Waynetta Sandy, MD;  Location: [facility];  Service: Cardiovascular;  Laterality:  N/A;   MEDIAL PARTIAL KNEE REPLACEMENT     NECK SURGERY     PARTIAL HIP ARTHROPLASTY Right    TONSILLECTOMY AND ADENOIDECTOMY     Pt. stated,   VAGINAL HYSTERECTOMY      MEDICATIONS: No current facility-administered medications for this encounter.    albuterol (VENTOLIN HFA) 108 (90 Base) MCG/ACT inhaler   amLODipine (NORVASC) 5 MG tablet   BELSOMRA 15 MG TABS   budesonide-formoterol (SYMBICORT) 160-4.5 MCG/ACT inhaler   denosumab (PROLIA) 60 MG/ML SOSY injection   famotidine (PEPCID) 40 MG tablet   furosemide (LASIX) 20 MG tablet   gabapentin (NEURONTIN) 100 MG capsule   HYDROcodone-acetaminophen (NORCO/VICODIN) 5-325 MG tablet   ibuprofen (ADVIL) 200 MG tablet   LORazepam (ATIVAN) 0.5 MG tablet   omeprazole (PRILOSEC) 40 MG capsule   pravastatin (PRAVACHOL) 40 MG tablet   QUEtiapine (SEROQUEL) 200 MG tablet   telmisartan (MICARDIS) 80 MG tablet   venlafaxine XR (EFFEXOR-XR) 75 MG 24 hr capsule   Maia Plan [facility] Pre-Surgical Testing [phone] 12/31/19  1:46 PM

## 2020-01-02 ENCOUNTER — Ambulatory Visit (HOSPITAL_COMMUNITY)
Admission: RE | Admit: 2020-01-02 | Disposition: A | Discharge status: Home / Self Care | Payer: PPO | Source: Home / Self Care | Attending: Orthopedic Surgery | Admitting: Orthopedic Surgery

## 2020-01-02 DIAGNOSIS — K219 Gastro-esophageal reflux disease without esophagitis: Secondary | ICD-10-CM | POA: Insufficient documentation

## 2020-01-02 DIAGNOSIS — Z79899 Other long term (current) drug therapy: Secondary | ICD-10-CM | POA: Insufficient documentation

## 2020-01-02 DIAGNOSIS — D509 Iron deficiency anemia, unspecified: Secondary | ICD-10-CM | POA: Diagnosis not present

## 2020-01-02 DIAGNOSIS — E785 Hyperlipidemia, unspecified: Secondary | ICD-10-CM | POA: Insufficient documentation

## 2020-01-02 DIAGNOSIS — M797 Fibromyalgia: Secondary | ICD-10-CM | POA: Insufficient documentation

## 2020-01-02 DIAGNOSIS — I10 Essential (primary) hypertension: Secondary | ICD-10-CM | POA: Insufficient documentation

## 2020-01-02 DIAGNOSIS — Z7901 Long term (current) use of anticoagulants: Secondary | ICD-10-CM | POA: Insufficient documentation

## 2020-01-02 DIAGNOSIS — Z01818 Encounter for other preprocedural examination: Secondary | ICD-10-CM | POA: Insufficient documentation

## 2020-01-02 DIAGNOSIS — M1612 Unilateral primary osteoarthritis, left hip: Secondary | ICD-10-CM | POA: Insufficient documentation

## 2020-01-02 LAB — TYPE AND SCREEN
ABO/RH(D): A NEG
Antibody Screen: NEGATIVE

## 2020-01-02 SURGERY — ARTHROPLASTY, HIP, TOTAL, ANTERIOR APPROACH
Anesthesia: Spinal | Site: Hip | Laterality: Left

## 2020-01-03 DIAGNOSIS — D509 Iron deficiency anemia, unspecified: Secondary | ICD-10-CM | POA: Diagnosis not present

## 2020-01-13 DIAGNOSIS — D509 Iron deficiency anemia, unspecified: Secondary | ICD-10-CM | POA: Diagnosis not present

## 2020-01-20 DIAGNOSIS — R634 Abnormal weight loss: Secondary | ICD-10-CM | POA: Diagnosis not present

## 2020-01-20 DIAGNOSIS — D500 Iron deficiency anemia secondary to blood loss (chronic): Secondary | ICD-10-CM | POA: Diagnosis not present

## 2020-01-20 DIAGNOSIS — K635 Polyp of colon: Secondary | ICD-10-CM | POA: Diagnosis not present

## 2020-01-23 DIAGNOSIS — Z20822 Contact with and (suspected) exposure to covid-19: Secondary | ICD-10-CM | POA: Diagnosis not present

## 2020-01-28 DIAGNOSIS — D509 Iron deficiency anemia, unspecified: Secondary | ICD-10-CM | POA: Diagnosis not present

## 2020-01-28 DIAGNOSIS — K5900 Constipation, unspecified: Secondary | ICD-10-CM | POA: Diagnosis not present

## 2020-01-28 DIAGNOSIS — K222 Esophageal obstruction: Secondary | ICD-10-CM | POA: Diagnosis not present

## 2020-01-28 DIAGNOSIS — K2940 Chronic atrophic gastritis without bleeding: Secondary | ICD-10-CM | POA: Diagnosis not present

## 2020-01-28 DIAGNOSIS — K317 Polyp of stomach and duodenum: Secondary | ICD-10-CM | POA: Diagnosis not present

## 2020-01-28 DIAGNOSIS — D649 Anemia, unspecified: Secondary | ICD-10-CM | POA: Diagnosis not present

## 2020-01-28 DIAGNOSIS — R634 Abnormal weight loss: Secondary | ICD-10-CM | POA: Diagnosis not present

## 2020-01-28 DIAGNOSIS — K449 Diaphragmatic hernia without obstruction or gangrene: Secondary | ICD-10-CM | POA: Diagnosis not present

## 2020-02-03 DIAGNOSIS — D509 Iron deficiency anemia, unspecified: Secondary | ICD-10-CM | POA: Diagnosis not present

## 2020-02-03 DIAGNOSIS — K222 Esophageal obstruction: Secondary | ICD-10-CM | POA: Diagnosis not present

## 2020-02-04 DIAGNOSIS — Z0289 Encounter for other administrative examinations: Secondary | ICD-10-CM | POA: Diagnosis not present

## 2020-02-17 DIAGNOSIS — Z111 Encounter for screening for respiratory tuberculosis: Secondary | ICD-10-CM | POA: Diagnosis not present

## 2020-02-24 DIAGNOSIS — R300 Dysuria: Secondary | ICD-10-CM | POA: Diagnosis not present

## 2020-02-25 DIAGNOSIS — Z20828 Contact with and (suspected) exposure to other viral communicable diseases: Secondary | ICD-10-CM | POA: Diagnosis not present

## 2020-03-05 DIAGNOSIS — I2510 Atherosclerotic heart disease of native coronary artery without angina pectoris: Secondary | ICD-10-CM | POA: Diagnosis not present

## 2020-03-05 DIAGNOSIS — M1990 Unspecified osteoarthritis, unspecified site: Secondary | ICD-10-CM | POA: Diagnosis not present

## 2020-03-05 DIAGNOSIS — I10 Essential (primary) hypertension: Secondary | ICD-10-CM | POA: Diagnosis not present

## 2020-03-05 DIAGNOSIS — K5900 Constipation, unspecified: Secondary | ICD-10-CM | POA: Diagnosis not present

## 2020-03-12 DIAGNOSIS — F419 Anxiety disorder, unspecified: Secondary | ICD-10-CM | POA: Diagnosis not present

## 2020-03-12 DIAGNOSIS — F332 Major depressive disorder, recurrent severe without psychotic features: Secondary | ICD-10-CM | POA: Diagnosis not present

## 2020-03-12 DIAGNOSIS — L853 Xerosis cutis: Secondary | ICD-10-CM | POA: Diagnosis not present

## 2020-03-12 DIAGNOSIS — G4700 Insomnia, unspecified: Secondary | ICD-10-CM | POA: Diagnosis not present

## 2020-03-13 DIAGNOSIS — E119 Type 2 diabetes mellitus without complications: Secondary | ICD-10-CM | POA: Diagnosis not present

## 2020-03-13 DIAGNOSIS — E7849 Other hyperlipidemia: Secondary | ICD-10-CM | POA: Diagnosis not present

## 2020-03-13 DIAGNOSIS — E038 Other specified hypothyroidism: Secondary | ICD-10-CM | POA: Diagnosis not present

## 2020-03-13 DIAGNOSIS — E559 Vitamin D deficiency, unspecified: Secondary | ICD-10-CM | POA: Diagnosis not present

## 2020-03-13 DIAGNOSIS — D518 Other vitamin B12 deficiency anemias: Secondary | ICD-10-CM | POA: Diagnosis not present

## 2020-03-13 DIAGNOSIS — Z79899 Other long term (current) drug therapy: Secondary | ICD-10-CM | POA: Diagnosis not present

## 2020-03-19 DIAGNOSIS — E441 Mild protein-calorie malnutrition: Secondary | ICD-10-CM | POA: Diagnosis not present

## 2020-03-19 DIAGNOSIS — D509 Iron deficiency anemia, unspecified: Secondary | ICD-10-CM | POA: Diagnosis not present

## 2020-03-19 DIAGNOSIS — E559 Vitamin D deficiency, unspecified: Secondary | ICD-10-CM | POA: Diagnosis not present

## 2020-03-19 DIAGNOSIS — R7989 Other specified abnormal findings of blood chemistry: Secondary | ICD-10-CM | POA: Diagnosis not present

## 2020-03-22 DIAGNOSIS — R5383 Other fatigue: Secondary | ICD-10-CM | POA: Diagnosis not present

## 2020-03-22 DIAGNOSIS — G4700 Insomnia, unspecified: Secondary | ICD-10-CM | POA: Diagnosis not present

## 2020-03-22 DIAGNOSIS — R32 Unspecified urinary incontinence: Secondary | ICD-10-CM | POA: Diagnosis not present

## 2020-03-22 DIAGNOSIS — F419 Anxiety disorder, unspecified: Secondary | ICD-10-CM | POA: Diagnosis not present

## 2020-03-22 DIAGNOSIS — K5900 Constipation, unspecified: Secondary | ICD-10-CM | POA: Diagnosis not present

## 2020-03-22 DIAGNOSIS — K589 Irritable bowel syndrome without diarrhea: Secondary | ICD-10-CM | POA: Diagnosis not present

## 2020-03-22 DIAGNOSIS — M797 Fibromyalgia: Secondary | ICD-10-CM | POA: Diagnosis not present

## 2020-03-22 DIAGNOSIS — M1990 Unspecified osteoarthritis, unspecified site: Secondary | ICD-10-CM | POA: Diagnosis not present

## 2020-03-22 DIAGNOSIS — R2681 Unsteadiness on feet: Secondary | ICD-10-CM | POA: Diagnosis not present

## 2020-03-22 DIAGNOSIS — N390 Urinary tract infection, site not specified: Secondary | ICD-10-CM | POA: Diagnosis not present

## 2020-03-22 DIAGNOSIS — R296 Repeated falls: Secondary | ICD-10-CM | POA: Diagnosis not present

## 2020-03-22 DIAGNOSIS — Z79899 Other long term (current) drug therapy: Secondary | ICD-10-CM | POA: Diagnosis not present

## 2020-03-22 DIAGNOSIS — M6281 Muscle weakness (generalized): Secondary | ICD-10-CM | POA: Diagnosis not present

## 2020-03-22 DIAGNOSIS — Z86010 Personal history of colonic polyps: Secondary | ICD-10-CM | POA: Diagnosis not present

## 2020-03-22 DIAGNOSIS — E785 Hyperlipidemia, unspecified: Secondary | ICD-10-CM | POA: Diagnosis not present

## 2020-03-22 DIAGNOSIS — D509 Iron deficiency anemia, unspecified: Secondary | ICD-10-CM | POA: Diagnosis not present

## 2020-03-22 DIAGNOSIS — J45909 Unspecified asthma, uncomplicated: Secondary | ICD-10-CM | POA: Diagnosis not present

## 2020-03-22 DIAGNOSIS — F332 Major depressive disorder, recurrent severe without psychotic features: Secondary | ICD-10-CM | POA: Diagnosis not present

## 2020-03-22 DIAGNOSIS — I2510 Atherosclerotic heart disease of native coronary artery without angina pectoris: Secondary | ICD-10-CM | POA: Diagnosis not present

## 2020-03-22 DIAGNOSIS — Z86711 Personal history of pulmonary embolism: Secondary | ICD-10-CM | POA: Diagnosis not present

## 2020-03-22 DIAGNOSIS — M810 Age-related osteoporosis without current pathological fracture: Secondary | ICD-10-CM | POA: Diagnosis not present

## 2020-03-22 DIAGNOSIS — K219 Gastro-esophageal reflux disease without esophagitis: Secondary | ICD-10-CM | POA: Diagnosis not present

## 2020-03-22 DIAGNOSIS — I10 Essential (primary) hypertension: Secondary | ICD-10-CM | POA: Diagnosis not present

## 2020-03-22 DIAGNOSIS — G3184 Mild cognitive impairment, so stated: Secondary | ICD-10-CM | POA: Diagnosis not present

## 2020-03-22 DIAGNOSIS — Z9181 History of falling: Secondary | ICD-10-CM | POA: Diagnosis not present

## 2020-03-26 DIAGNOSIS — G4709 Other insomnia: Secondary | ICD-10-CM | POA: Diagnosis not present

## 2020-03-26 DIAGNOSIS — F329 Major depressive disorder, single episode, unspecified: Secondary | ICD-10-CM | POA: Diagnosis not present

## 2020-03-26 DIAGNOSIS — G3184 Mild cognitive impairment, so stated: Secondary | ICD-10-CM | POA: Diagnosis not present

## 2020-03-26 DIAGNOSIS — F419 Anxiety disorder, unspecified: Secondary | ICD-10-CM | POA: Diagnosis not present

## 2020-04-09 DIAGNOSIS — M1990 Unspecified osteoarthritis, unspecified site: Secondary | ICD-10-CM | POA: Diagnosis not present

## 2020-04-09 DIAGNOSIS — F419 Anxiety disorder, unspecified: Secondary | ICD-10-CM | POA: Diagnosis not present

## 2020-04-09 DIAGNOSIS — R2681 Unsteadiness on feet: Secondary | ICD-10-CM | POA: Diagnosis not present

## 2020-04-09 DIAGNOSIS — L02224 Furuncle of groin: Secondary | ICD-10-CM | POA: Diagnosis not present

## 2020-04-14 DIAGNOSIS — L600 Ingrowing nail: Secondary | ICD-10-CM | POA: Diagnosis not present

## 2020-04-16 DIAGNOSIS — F419 Anxiety disorder, unspecified: Secondary | ICD-10-CM | POA: Diagnosis not present

## 2020-04-16 DIAGNOSIS — F329 Major depressive disorder, single episode, unspecified: Secondary | ICD-10-CM | POA: Diagnosis not present

## 2020-04-16 DIAGNOSIS — L02224 Furuncle of groin: Secondary | ICD-10-CM | POA: Diagnosis not present

## 2020-04-16 DIAGNOSIS — F0391 Unspecified dementia with behavioral disturbance: Secondary | ICD-10-CM | POA: Diagnosis not present

## 2020-04-16 DIAGNOSIS — G4700 Insomnia, unspecified: Secondary | ICD-10-CM | POA: Diagnosis not present

## 2020-04-16 DIAGNOSIS — G3184 Mild cognitive impairment, so stated: Secondary | ICD-10-CM | POA: Diagnosis not present

## 2020-04-21 DIAGNOSIS — N390 Urinary tract infection, site not specified: Secondary | ICD-10-CM | POA: Diagnosis not present

## 2020-04-21 DIAGNOSIS — I10 Essential (primary) hypertension: Secondary | ICD-10-CM | POA: Diagnosis not present

## 2020-04-21 DIAGNOSIS — R5383 Other fatigue: Secondary | ICD-10-CM | POA: Diagnosis not present

## 2020-04-21 DIAGNOSIS — I2510 Atherosclerotic heart disease of native coronary artery without angina pectoris: Secondary | ICD-10-CM | POA: Diagnosis not present

## 2020-04-21 DIAGNOSIS — F419 Anxiety disorder, unspecified: Secondary | ICD-10-CM | POA: Diagnosis not present

## 2020-04-21 DIAGNOSIS — M1990 Unspecified osteoarthritis, unspecified site: Secondary | ICD-10-CM | POA: Diagnosis not present

## 2020-04-21 DIAGNOSIS — Z86711 Personal history of pulmonary embolism: Secondary | ICD-10-CM | POA: Diagnosis not present

## 2020-04-21 DIAGNOSIS — K589 Irritable bowel syndrome without diarrhea: Secondary | ICD-10-CM | POA: Diagnosis not present

## 2020-04-21 DIAGNOSIS — E785 Hyperlipidemia, unspecified: Secondary | ICD-10-CM | POA: Diagnosis not present

## 2020-04-21 DIAGNOSIS — R32 Unspecified urinary incontinence: Secondary | ICD-10-CM | POA: Diagnosis not present

## 2020-04-21 DIAGNOSIS — G4700 Insomnia, unspecified: Secondary | ICD-10-CM | POA: Diagnosis not present

## 2020-04-21 DIAGNOSIS — K219 Gastro-esophageal reflux disease without esophagitis: Secondary | ICD-10-CM | POA: Diagnosis not present

## 2020-04-21 DIAGNOSIS — D509 Iron deficiency anemia, unspecified: Secondary | ICD-10-CM | POA: Diagnosis not present

## 2020-04-21 DIAGNOSIS — R296 Repeated falls: Secondary | ICD-10-CM | POA: Diagnosis not present

## 2020-04-21 DIAGNOSIS — K5900 Constipation, unspecified: Secondary | ICD-10-CM | POA: Diagnosis not present

## 2020-04-21 DIAGNOSIS — J45909 Unspecified asthma, uncomplicated: Secondary | ICD-10-CM | POA: Diagnosis not present

## 2020-04-21 DIAGNOSIS — G3184 Mild cognitive impairment, so stated: Secondary | ICD-10-CM | POA: Diagnosis not present

## 2020-04-21 DIAGNOSIS — Z9181 History of falling: Secondary | ICD-10-CM | POA: Diagnosis not present

## 2020-04-21 DIAGNOSIS — R2681 Unsteadiness on feet: Secondary | ICD-10-CM | POA: Diagnosis not present

## 2020-04-21 DIAGNOSIS — M6281 Muscle weakness (generalized): Secondary | ICD-10-CM | POA: Diagnosis not present

## 2020-04-21 DIAGNOSIS — Z79899 Other long term (current) drug therapy: Secondary | ICD-10-CM | POA: Diagnosis not present

## 2020-04-21 DIAGNOSIS — M797 Fibromyalgia: Secondary | ICD-10-CM | POA: Diagnosis not present

## 2020-04-21 DIAGNOSIS — M810 Age-related osteoporosis without current pathological fracture: Secondary | ICD-10-CM | POA: Diagnosis not present

## 2020-04-21 DIAGNOSIS — Z86010 Personal history of colonic polyps: Secondary | ICD-10-CM | POA: Diagnosis not present

## 2020-04-21 DIAGNOSIS — F332 Major depressive disorder, recurrent severe without psychotic features: Secondary | ICD-10-CM | POA: Diagnosis not present

## 2020-04-22 DIAGNOSIS — M1990 Unspecified osteoarthritis, unspecified site: Secondary | ICD-10-CM | POA: Diagnosis not present

## 2020-04-22 DIAGNOSIS — R2681 Unsteadiness on feet: Secondary | ICD-10-CM | POA: Diagnosis not present

## 2020-04-22 DIAGNOSIS — R410 Disorientation, unspecified: Secondary | ICD-10-CM | POA: Diagnosis not present

## 2020-04-22 DIAGNOSIS — Z9181 History of falling: Secondary | ICD-10-CM | POA: Diagnosis not present

## 2020-04-25 DIAGNOSIS — R8290 Unspecified abnormal findings in urine: Secondary | ICD-10-CM | POA: Diagnosis not present

## 2020-05-14 DIAGNOSIS — E785 Hyperlipidemia, unspecified: Secondary | ICD-10-CM | POA: Diagnosis not present

## 2020-05-14 DIAGNOSIS — G3184 Mild cognitive impairment, so stated: Secondary | ICD-10-CM | POA: Diagnosis not present

## 2020-05-14 DIAGNOSIS — F419 Anxiety disorder, unspecified: Secondary | ICD-10-CM | POA: Diagnosis not present

## 2020-05-14 DIAGNOSIS — I10 Essential (primary) hypertension: Secondary | ICD-10-CM | POA: Diagnosis not present

## 2020-05-14 DIAGNOSIS — G4700 Insomnia, unspecified: Secondary | ICD-10-CM | POA: Diagnosis not present

## 2020-05-14 DIAGNOSIS — K219 Gastro-esophageal reflux disease without esophagitis: Secondary | ICD-10-CM | POA: Diagnosis not present

## 2020-05-14 DIAGNOSIS — F339 Major depressive disorder, recurrent, unspecified: Secondary | ICD-10-CM | POA: Diagnosis not present

## 2020-05-14 DIAGNOSIS — F329 Major depressive disorder, single episode, unspecified: Secondary | ICD-10-CM | POA: Diagnosis not present

## 2020-05-22 DIAGNOSIS — G4700 Insomnia, unspecified: Secondary | ICD-10-CM | POA: Diagnosis not present

## 2020-05-22 DIAGNOSIS — F339 Major depressive disorder, recurrent, unspecified: Secondary | ICD-10-CM | POA: Diagnosis not present

## 2020-05-22 DIAGNOSIS — I10 Essential (primary) hypertension: Secondary | ICD-10-CM | POA: Diagnosis not present

## 2020-05-22 DIAGNOSIS — F0631 Mood disorder due to known physiological condition with depressive features: Secondary | ICD-10-CM | POA: Diagnosis not present

## 2020-05-22 DIAGNOSIS — E785 Hyperlipidemia, unspecified: Secondary | ICD-10-CM | POA: Diagnosis not present

## 2020-06-15 DIAGNOSIS — G4700 Insomnia, unspecified: Secondary | ICD-10-CM | POA: Diagnosis not present

## 2020-06-15 DIAGNOSIS — F339 Major depressive disorder, recurrent, unspecified: Secondary | ICD-10-CM | POA: Diagnosis not present

## 2020-06-15 DIAGNOSIS — F0631 Mood disorder due to known physiological condition with depressive features: Secondary | ICD-10-CM | POA: Diagnosis not present

## 2020-06-17 DIAGNOSIS — F339 Major depressive disorder, recurrent, unspecified: Secondary | ICD-10-CM | POA: Diagnosis not present

## 2020-06-17 DIAGNOSIS — I10 Essential (primary) hypertension: Secondary | ICD-10-CM | POA: Diagnosis not present

## 2020-06-17 DIAGNOSIS — E785 Hyperlipidemia, unspecified: Secondary | ICD-10-CM | POA: Diagnosis not present

## 2020-06-17 DIAGNOSIS — K219 Gastro-esophageal reflux disease without esophagitis: Secondary | ICD-10-CM | POA: Diagnosis not present

## 2020-07-15 DIAGNOSIS — J441 Chronic obstructive pulmonary disease with (acute) exacerbation: Secondary | ICD-10-CM | POA: Diagnosis not present

## 2020-07-15 DIAGNOSIS — R059 Cough, unspecified: Secondary | ICD-10-CM | POA: Diagnosis not present

## 2020-07-15 DIAGNOSIS — I10 Essential (primary) hypertension: Secondary | ICD-10-CM | POA: Diagnosis not present

## 2020-07-15 DIAGNOSIS — J209 Acute bronchitis, unspecified: Secondary | ICD-10-CM | POA: Diagnosis not present

## 2020-07-16 DIAGNOSIS — F0630 Mood disorder due to known physiological condition, unspecified: Secondary | ICD-10-CM | POA: Diagnosis not present

## 2020-07-16 DIAGNOSIS — E782 Mixed hyperlipidemia: Secondary | ICD-10-CM | POA: Diagnosis not present

## 2020-07-16 DIAGNOSIS — F0631 Mood disorder due to known physiological condition with depressive features: Secondary | ICD-10-CM | POA: Diagnosis not present

## 2020-07-16 DIAGNOSIS — F339 Major depressive disorder, recurrent, unspecified: Secondary | ICD-10-CM | POA: Diagnosis not present

## 2020-07-16 DIAGNOSIS — I2510 Atherosclerotic heart disease of native coronary artery without angina pectoris: Secondary | ICD-10-CM | POA: Diagnosis not present

## 2020-07-16 DIAGNOSIS — K5900 Constipation, unspecified: Secondary | ICD-10-CM | POA: Diagnosis not present

## 2020-07-16 DIAGNOSIS — G4701 Insomnia due to medical condition: Secondary | ICD-10-CM | POA: Diagnosis not present

## 2020-08-13 DIAGNOSIS — J441 Chronic obstructive pulmonary disease with (acute) exacerbation: Secondary | ICD-10-CM | POA: Diagnosis not present

## 2020-08-13 DIAGNOSIS — J45909 Unspecified asthma, uncomplicated: Secondary | ICD-10-CM | POA: Diagnosis not present

## 2020-08-13 DIAGNOSIS — Z8616 Personal history of COVID-19: Secondary | ICD-10-CM | POA: Diagnosis not present

## 2020-08-13 DIAGNOSIS — R059 Cough, unspecified: Secondary | ICD-10-CM | POA: Diagnosis not present

## 2020-08-13 DIAGNOSIS — U071 COVID-19: Secondary | ICD-10-CM | POA: Diagnosis not present

## 2020-08-13 DIAGNOSIS — Z20822 Contact with and (suspected) exposure to covid-19: Secondary | ICD-10-CM | POA: Diagnosis not present

## 2020-08-13 DIAGNOSIS — I10 Essential (primary) hypertension: Secondary | ICD-10-CM | POA: Diagnosis not present

## 2020-08-13 DIAGNOSIS — J069 Acute upper respiratory infection, unspecified: Secondary | ICD-10-CM | POA: Diagnosis not present

## 2020-08-13 DIAGNOSIS — R0602 Shortness of breath: Secondary | ICD-10-CM | POA: Diagnosis not present

## 2020-08-18 DIAGNOSIS — U071 COVID-19: Secondary | ICD-10-CM | POA: Diagnosis not present

## 2020-08-19 DIAGNOSIS — F329 Major depressive disorder, single episode, unspecified: Secondary | ICD-10-CM | POA: Diagnosis not present

## 2020-08-19 DIAGNOSIS — F339 Major depressive disorder, recurrent, unspecified: Secondary | ICD-10-CM | POA: Diagnosis not present

## 2020-08-20 DIAGNOSIS — I10 Essential (primary) hypertension: Secondary | ICD-10-CM | POA: Diagnosis not present

## 2020-08-20 DIAGNOSIS — J17 Pneumonia in diseases classified elsewhere: Secondary | ICD-10-CM | POA: Diagnosis not present

## 2020-08-20 DIAGNOSIS — U071 COVID-19: Secondary | ICD-10-CM | POA: Diagnosis not present

## 2020-08-20 DIAGNOSIS — R0602 Shortness of breath: Secondary | ICD-10-CM | POA: Diagnosis not present

## 2020-08-20 DIAGNOSIS — J441 Chronic obstructive pulmonary disease with (acute) exacerbation: Secondary | ICD-10-CM | POA: Diagnosis not present

## 2020-08-20 DIAGNOSIS — Z8616 Personal history of COVID-19: Secondary | ICD-10-CM | POA: Diagnosis not present

## 2020-08-20 DIAGNOSIS — Z20822 Contact with and (suspected) exposure to covid-19: Secondary | ICD-10-CM | POA: Diagnosis not present

## 2020-08-27 DIAGNOSIS — M6281 Muscle weakness (generalized): Secondary | ICD-10-CM | POA: Diagnosis not present

## 2020-08-27 DIAGNOSIS — U099 Post covid-19 condition, unspecified: Secondary | ICD-10-CM | POA: Diagnosis not present

## 2020-08-27 DIAGNOSIS — R5381 Other malaise: Secondary | ICD-10-CM | POA: Diagnosis not present

## 2020-08-27 DIAGNOSIS — R0602 Shortness of breath: Secondary | ICD-10-CM | POA: Diagnosis not present

## 2020-08-28 DIAGNOSIS — F0630 Mood disorder due to known physiological condition, unspecified: Secondary | ICD-10-CM | POA: Diagnosis not present

## 2020-08-28 DIAGNOSIS — F339 Major depressive disorder, recurrent, unspecified: Secondary | ICD-10-CM | POA: Diagnosis not present

## 2020-08-28 DIAGNOSIS — G4700 Insomnia, unspecified: Secondary | ICD-10-CM | POA: Diagnosis not present

## 2020-09-02 DIAGNOSIS — Z8616 Personal history of COVID-19: Secondary | ICD-10-CM | POA: Diagnosis not present

## 2020-09-02 DIAGNOSIS — Z09 Encounter for follow-up examination after completed treatment for conditions other than malignant neoplasm: Secondary | ICD-10-CM | POA: Diagnosis not present

## 2020-09-10 DIAGNOSIS — I10 Essential (primary) hypertension: Secondary | ICD-10-CM | POA: Diagnosis not present

## 2020-09-10 DIAGNOSIS — W19XXXA Unspecified fall, initial encounter: Secondary | ICD-10-CM | POA: Diagnosis not present

## 2020-09-10 DIAGNOSIS — M1991 Primary osteoarthritis, unspecified site: Secondary | ICD-10-CM | POA: Diagnosis not present

## 2020-09-10 DIAGNOSIS — J449 Chronic obstructive pulmonary disease, unspecified: Secondary | ICD-10-CM | POA: Diagnosis not present

## 2020-09-10 DIAGNOSIS — M545 Low back pain, unspecified: Secondary | ICD-10-CM | POA: Diagnosis not present

## 2020-09-17 DIAGNOSIS — M6281 Muscle weakness (generalized): Secondary | ICD-10-CM | POA: Diagnosis not present

## 2020-09-17 DIAGNOSIS — A630 Anogenital (venereal) warts: Secondary | ICD-10-CM | POA: Diagnosis not present

## 2020-09-17 DIAGNOSIS — R0602 Shortness of breath: Secondary | ICD-10-CM | POA: Diagnosis not present

## 2020-09-17 DIAGNOSIS — U099 Post covid-19 condition, unspecified: Secondary | ICD-10-CM | POA: Diagnosis not present

## 2020-10-02 DIAGNOSIS — F0630 Mood disorder due to known physiological condition, unspecified: Secondary | ICD-10-CM | POA: Diagnosis not present

## 2020-10-02 DIAGNOSIS — F339 Major depressive disorder, recurrent, unspecified: Secondary | ICD-10-CM | POA: Diagnosis not present

## 2020-10-09 DIAGNOSIS — F339 Major depressive disorder, recurrent, unspecified: Secondary | ICD-10-CM | POA: Diagnosis not present

## 2020-10-09 DIAGNOSIS — F0630 Mood disorder due to known physiological condition, unspecified: Secondary | ICD-10-CM | POA: Diagnosis not present

## 2020-10-12 DIAGNOSIS — F339 Major depressive disorder, recurrent, unspecified: Secondary | ICD-10-CM | POA: Diagnosis not present

## 2020-10-12 DIAGNOSIS — F0630 Mood disorder due to known physiological condition, unspecified: Secondary | ICD-10-CM | POA: Diagnosis not present

## 2020-10-12 DIAGNOSIS — G4700 Insomnia, unspecified: Secondary | ICD-10-CM | POA: Diagnosis not present

## 2020-10-15 DIAGNOSIS — F339 Major depressive disorder, recurrent, unspecified: Secondary | ICD-10-CM | POA: Diagnosis not present

## 2020-10-15 DIAGNOSIS — I10 Essential (primary) hypertension: Secondary | ICD-10-CM | POA: Diagnosis not present

## 2020-10-15 DIAGNOSIS — M1991 Primary osteoarthritis, unspecified site: Secondary | ICD-10-CM | POA: Diagnosis not present

## 2020-10-15 DIAGNOSIS — J449 Chronic obstructive pulmonary disease, unspecified: Secondary | ICD-10-CM | POA: Diagnosis not present

## 2020-10-29 DIAGNOSIS — I10 Essential (primary) hypertension: Secondary | ICD-10-CM | POA: Diagnosis not present

## 2020-10-29 DIAGNOSIS — E785 Hyperlipidemia, unspecified: Secondary | ICD-10-CM | POA: Diagnosis not present

## 2020-10-29 DIAGNOSIS — M159 Polyosteoarthritis, unspecified: Secondary | ICD-10-CM | POA: Diagnosis not present

## 2020-10-29 DIAGNOSIS — R296 Repeated falls: Secondary | ICD-10-CM | POA: Diagnosis not present

## 2020-11-17 DIAGNOSIS — F0630 Mood disorder due to known physiological condition, unspecified: Secondary | ICD-10-CM | POA: Diagnosis not present

## 2020-11-17 DIAGNOSIS — D518 Other vitamin B12 deficiency anemias: Secondary | ICD-10-CM | POA: Diagnosis not present

## 2020-11-17 DIAGNOSIS — Z79899 Other long term (current) drug therapy: Secondary | ICD-10-CM | POA: Diagnosis not present

## 2020-11-17 DIAGNOSIS — E7849 Other hyperlipidemia: Secondary | ICD-10-CM | POA: Diagnosis not present

## 2020-11-17 DIAGNOSIS — E119 Type 2 diabetes mellitus without complications: Secondary | ICD-10-CM | POA: Diagnosis not present

## 2020-11-17 DIAGNOSIS — F339 Major depressive disorder, recurrent, unspecified: Secondary | ICD-10-CM | POA: Diagnosis not present

## 2020-11-17 DIAGNOSIS — G4700 Insomnia, unspecified: Secondary | ICD-10-CM | POA: Diagnosis not present

## 2020-11-19 DIAGNOSIS — J449 Chronic obstructive pulmonary disease, unspecified: Secondary | ICD-10-CM | POA: Diagnosis not present

## 2020-11-19 DIAGNOSIS — E870 Hyperosmolality and hypernatremia: Secondary | ICD-10-CM | POA: Diagnosis not present

## 2020-11-19 DIAGNOSIS — R2681 Unsteadiness on feet: Secondary | ICD-10-CM | POA: Diagnosis not present

## 2020-11-19 DIAGNOSIS — M1991 Primary osteoarthritis, unspecified site: Secondary | ICD-10-CM | POA: Diagnosis not present

## 2020-11-19 DIAGNOSIS — S90121A Contusion of right lesser toe(s) without damage to nail, initial encounter: Secondary | ICD-10-CM | POA: Diagnosis not present

## 2020-11-26 DIAGNOSIS — R062 Wheezing: Secondary | ICD-10-CM | POA: Diagnosis not present

## 2020-11-26 DIAGNOSIS — B372 Candidiasis of skin and nail: Secondary | ICD-10-CM | POA: Diagnosis not present

## 2020-11-26 DIAGNOSIS — M19011 Primary osteoarthritis, right shoulder: Secondary | ICD-10-CM | POA: Diagnosis not present

## 2020-11-26 DIAGNOSIS — R059 Cough, unspecified: Secondary | ICD-10-CM | POA: Diagnosis not present

## 2020-12-17 DIAGNOSIS — F0630 Mood disorder due to known physiological condition, unspecified: Secondary | ICD-10-CM | POA: Diagnosis not present

## 2020-12-17 DIAGNOSIS — F339 Major depressive disorder, recurrent, unspecified: Secondary | ICD-10-CM | POA: Diagnosis not present

## 2020-12-17 DIAGNOSIS — G4700 Insomnia, unspecified: Secondary | ICD-10-CM | POA: Diagnosis not present

## 2020-12-19 DIAGNOSIS — J449 Chronic obstructive pulmonary disease, unspecified: Secondary | ICD-10-CM | POA: Diagnosis not present

## 2020-12-31 DIAGNOSIS — B372 Candidiasis of skin and nail: Secondary | ICD-10-CM | POA: Diagnosis not present

## 2020-12-31 DIAGNOSIS — M6281 Muscle weakness (generalized): Secondary | ICD-10-CM | POA: Diagnosis not present

## 2020-12-31 DIAGNOSIS — G894 Chronic pain syndrome: Secondary | ICD-10-CM | POA: Diagnosis not present

## 2020-12-31 DIAGNOSIS — M159 Polyosteoarthritis, unspecified: Secondary | ICD-10-CM | POA: Diagnosis not present

## 2021-01-04 DIAGNOSIS — Z9181 History of falling: Secondary | ICD-10-CM | POA: Diagnosis not present

## 2021-01-04 DIAGNOSIS — F32A Depression, unspecified: Secondary | ICD-10-CM | POA: Diagnosis not present

## 2021-01-04 DIAGNOSIS — D649 Anemia, unspecified: Secondary | ICD-10-CM | POA: Diagnosis not present

## 2021-01-04 DIAGNOSIS — K219 Gastro-esophageal reflux disease without esophagitis: Secondary | ICD-10-CM | POA: Diagnosis not present

## 2021-01-04 DIAGNOSIS — M150 Primary generalized (osteo)arthritis: Secondary | ICD-10-CM | POA: Diagnosis not present

## 2021-01-04 DIAGNOSIS — I10 Essential (primary) hypertension: Secondary | ICD-10-CM | POA: Diagnosis not present

## 2021-01-04 DIAGNOSIS — G4700 Insomnia, unspecified: Secondary | ICD-10-CM | POA: Diagnosis not present

## 2021-01-04 DIAGNOSIS — Z7951 Long term (current) use of inhaled steroids: Secondary | ICD-10-CM | POA: Diagnosis not present

## 2021-01-04 DIAGNOSIS — E785 Hyperlipidemia, unspecified: Secondary | ICD-10-CM | POA: Diagnosis not present

## 2021-01-13 DIAGNOSIS — Z7951 Long term (current) use of inhaled steroids: Secondary | ICD-10-CM | POA: Diagnosis not present

## 2021-01-13 DIAGNOSIS — Z9181 History of falling: Secondary | ICD-10-CM | POA: Diagnosis not present

## 2021-01-13 DIAGNOSIS — K219 Gastro-esophageal reflux disease without esophagitis: Secondary | ICD-10-CM | POA: Diagnosis not present

## 2021-01-13 DIAGNOSIS — E785 Hyperlipidemia, unspecified: Secondary | ICD-10-CM | POA: Diagnosis not present

## 2021-01-13 DIAGNOSIS — G4700 Insomnia, unspecified: Secondary | ICD-10-CM | POA: Diagnosis not present

## 2021-01-13 DIAGNOSIS — F32A Depression, unspecified: Secondary | ICD-10-CM | POA: Diagnosis not present

## 2021-01-13 DIAGNOSIS — I10 Essential (primary) hypertension: Secondary | ICD-10-CM | POA: Diagnosis not present

## 2021-01-13 DIAGNOSIS — M150 Primary generalized (osteo)arthritis: Secondary | ICD-10-CM | POA: Diagnosis not present

## 2021-01-13 DIAGNOSIS — D649 Anemia, unspecified: Secondary | ICD-10-CM | POA: Diagnosis not present

## 2021-01-19 DIAGNOSIS — J449 Chronic obstructive pulmonary disease, unspecified: Secondary | ICD-10-CM | POA: Diagnosis not present

## 2021-01-20 DIAGNOSIS — Z9181 History of falling: Secondary | ICD-10-CM | POA: Diagnosis not present

## 2021-01-20 DIAGNOSIS — E785 Hyperlipidemia, unspecified: Secondary | ICD-10-CM | POA: Diagnosis not present

## 2021-01-20 DIAGNOSIS — I10 Essential (primary) hypertension: Secondary | ICD-10-CM | POA: Diagnosis not present

## 2021-01-20 DIAGNOSIS — G4700 Insomnia, unspecified: Secondary | ICD-10-CM | POA: Diagnosis not present

## 2021-01-20 DIAGNOSIS — K219 Gastro-esophageal reflux disease without esophagitis: Secondary | ICD-10-CM | POA: Diagnosis not present

## 2021-01-20 DIAGNOSIS — F32A Depression, unspecified: Secondary | ICD-10-CM | POA: Diagnosis not present

## 2021-01-20 DIAGNOSIS — Z7951 Long term (current) use of inhaled steroids: Secondary | ICD-10-CM | POA: Diagnosis not present

## 2021-01-20 DIAGNOSIS — D649 Anemia, unspecified: Secondary | ICD-10-CM | POA: Diagnosis not present

## 2021-01-20 DIAGNOSIS — M150 Primary generalized (osteo)arthritis: Secondary | ICD-10-CM | POA: Diagnosis not present

## 2021-01-21 DIAGNOSIS — K5904 Chronic idiopathic constipation: Secondary | ICD-10-CM | POA: Diagnosis not present

## 2021-01-21 DIAGNOSIS — F339 Major depressive disorder, recurrent, unspecified: Secondary | ICD-10-CM | POA: Diagnosis not present

## 2021-01-21 DIAGNOSIS — J449 Chronic obstructive pulmonary disease, unspecified: Secondary | ICD-10-CM | POA: Diagnosis not present

## 2021-01-21 DIAGNOSIS — G894 Chronic pain syndrome: Secondary | ICD-10-CM | POA: Diagnosis not present

## 2021-01-21 DIAGNOSIS — G4700 Insomnia, unspecified: Secondary | ICD-10-CM | POA: Diagnosis not present

## 2021-01-21 DIAGNOSIS — F0630 Mood disorder due to known physiological condition, unspecified: Secondary | ICD-10-CM | POA: Diagnosis not present

## 2021-01-21 DIAGNOSIS — I10 Essential (primary) hypertension: Secondary | ICD-10-CM | POA: Diagnosis not present

## 2021-01-22 DIAGNOSIS — E785 Hyperlipidemia, unspecified: Secondary | ICD-10-CM | POA: Diagnosis not present

## 2021-01-22 DIAGNOSIS — I10 Essential (primary) hypertension: Secondary | ICD-10-CM | POA: Diagnosis not present

## 2021-01-22 DIAGNOSIS — K219 Gastro-esophageal reflux disease without esophagitis: Secondary | ICD-10-CM | POA: Diagnosis not present

## 2021-01-22 DIAGNOSIS — Z7951 Long term (current) use of inhaled steroids: Secondary | ICD-10-CM | POA: Diagnosis not present

## 2021-01-22 DIAGNOSIS — Z9181 History of falling: Secondary | ICD-10-CM | POA: Diagnosis not present

## 2021-01-22 DIAGNOSIS — D649 Anemia, unspecified: Secondary | ICD-10-CM | POA: Diagnosis not present

## 2021-01-22 DIAGNOSIS — M150 Primary generalized (osteo)arthritis: Secondary | ICD-10-CM | POA: Diagnosis not present

## 2021-01-22 DIAGNOSIS — F32A Depression, unspecified: Secondary | ICD-10-CM | POA: Diagnosis not present

## 2021-01-22 DIAGNOSIS — G4700 Insomnia, unspecified: Secondary | ICD-10-CM | POA: Diagnosis not present

## 2021-02-04 DIAGNOSIS — R0602 Shortness of breath: Secondary | ICD-10-CM | POA: Diagnosis not present

## 2021-02-04 DIAGNOSIS — R600 Localized edema: Secondary | ICD-10-CM | POA: Diagnosis not present

## 2021-02-04 DIAGNOSIS — F419 Anxiety disorder, unspecified: Secondary | ICD-10-CM | POA: Diagnosis not present

## 2021-02-04 DIAGNOSIS — J449 Chronic obstructive pulmonary disease, unspecified: Secondary | ICD-10-CM | POA: Diagnosis not present

## 2021-02-05 DIAGNOSIS — R0602 Shortness of breath: Secondary | ICD-10-CM | POA: Diagnosis not present

## 2021-02-09 DIAGNOSIS — I5021 Acute systolic (congestive) heart failure: Secondary | ICD-10-CM | POA: Diagnosis not present

## 2021-02-09 DIAGNOSIS — Z79899 Other long term (current) drug therapy: Secondary | ICD-10-CM | POA: Diagnosis not present

## 2021-02-10 DIAGNOSIS — F339 Major depressive disorder, recurrent, unspecified: Secondary | ICD-10-CM | POA: Diagnosis not present

## 2021-02-10 DIAGNOSIS — G4700 Insomnia, unspecified: Secondary | ICD-10-CM | POA: Diagnosis not present

## 2021-02-10 DIAGNOSIS — F0630 Mood disorder due to known physiological condition, unspecified: Secondary | ICD-10-CM | POA: Diagnosis not present

## 2021-02-18 DIAGNOSIS — J449 Chronic obstructive pulmonary disease, unspecified: Secondary | ICD-10-CM | POA: Diagnosis not present

## 2021-02-18 DIAGNOSIS — R600 Localized edema: Secondary | ICD-10-CM | POA: Diagnosis not present

## 2021-02-18 DIAGNOSIS — F5101 Primary insomnia: Secondary | ICD-10-CM | POA: Diagnosis not present

## 2021-02-18 DIAGNOSIS — R0602 Shortness of breath: Secondary | ICD-10-CM | POA: Diagnosis not present

## 2021-02-18 DIAGNOSIS — F419 Anxiety disorder, unspecified: Secondary | ICD-10-CM | POA: Diagnosis not present

## 2021-02-22 DIAGNOSIS — R2242 Localized swelling, mass and lump, left lower limb: Secondary | ICD-10-CM | POA: Diagnosis not present

## 2021-02-24 DIAGNOSIS — Z7951 Long term (current) use of inhaled steroids: Secondary | ICD-10-CM | POA: Diagnosis not present

## 2021-02-24 DIAGNOSIS — I10 Essential (primary) hypertension: Secondary | ICD-10-CM | POA: Diagnosis not present

## 2021-02-24 DIAGNOSIS — M150 Primary generalized (osteo)arthritis: Secondary | ICD-10-CM | POA: Diagnosis not present

## 2021-02-24 DIAGNOSIS — D649 Anemia, unspecified: Secondary | ICD-10-CM | POA: Diagnosis not present

## 2021-02-24 DIAGNOSIS — K219 Gastro-esophageal reflux disease without esophagitis: Secondary | ICD-10-CM | POA: Diagnosis not present

## 2021-02-24 DIAGNOSIS — E119 Type 2 diabetes mellitus without complications: Secondary | ICD-10-CM | POA: Diagnosis not present

## 2021-02-24 DIAGNOSIS — E785 Hyperlipidemia, unspecified: Secondary | ICD-10-CM | POA: Diagnosis not present

## 2021-02-24 DIAGNOSIS — D518 Other vitamin B12 deficiency anemias: Secondary | ICD-10-CM | POA: Diagnosis not present

## 2021-02-24 DIAGNOSIS — I5021 Acute systolic (congestive) heart failure: Secondary | ICD-10-CM | POA: Diagnosis not present

## 2021-02-24 DIAGNOSIS — F32A Depression, unspecified: Secondary | ICD-10-CM | POA: Diagnosis not present

## 2021-02-24 DIAGNOSIS — Z79899 Other long term (current) drug therapy: Secondary | ICD-10-CM | POA: Diagnosis not present

## 2021-02-24 DIAGNOSIS — Z9181 History of falling: Secondary | ICD-10-CM | POA: Diagnosis not present

## 2021-02-24 DIAGNOSIS — E7849 Other hyperlipidemia: Secondary | ICD-10-CM | POA: Diagnosis not present

## 2021-02-24 DIAGNOSIS — G4700 Insomnia, unspecified: Secondary | ICD-10-CM | POA: Diagnosis not present

## 2021-02-25 DIAGNOSIS — F5101 Primary insomnia: Secondary | ICD-10-CM | POA: Diagnosis not present

## 2021-02-25 DIAGNOSIS — R600 Localized edema: Secondary | ICD-10-CM | POA: Diagnosis not present

## 2021-03-02 DIAGNOSIS — D518 Other vitamin B12 deficiency anemias: Secondary | ICD-10-CM | POA: Diagnosis not present

## 2021-03-02 DIAGNOSIS — E7849 Other hyperlipidemia: Secondary | ICD-10-CM | POA: Diagnosis not present

## 2021-03-02 DIAGNOSIS — E119 Type 2 diabetes mellitus without complications: Secondary | ICD-10-CM | POA: Diagnosis not present

## 2021-03-02 DIAGNOSIS — Z79899 Other long term (current) drug therapy: Secondary | ICD-10-CM | POA: Diagnosis not present

## 2021-03-02 DIAGNOSIS — I5021 Acute systolic (congestive) heart failure: Secondary | ICD-10-CM | POA: Diagnosis not present

## 2021-03-10 DIAGNOSIS — F339 Major depressive disorder, recurrent, unspecified: Secondary | ICD-10-CM | POA: Diagnosis not present

## 2021-03-10 DIAGNOSIS — F5101 Primary insomnia: Secondary | ICD-10-CM | POA: Diagnosis not present

## 2021-03-10 DIAGNOSIS — R440 Auditory hallucinations: Secondary | ICD-10-CM | POA: Diagnosis not present

## 2021-03-11 DIAGNOSIS — Z7951 Long term (current) use of inhaled steroids: Secondary | ICD-10-CM | POA: Diagnosis not present

## 2021-03-11 DIAGNOSIS — K219 Gastro-esophageal reflux disease without esophagitis: Secondary | ICD-10-CM | POA: Diagnosis not present

## 2021-03-11 DIAGNOSIS — I10 Essential (primary) hypertension: Secondary | ICD-10-CM | POA: Diagnosis not present

## 2021-03-11 DIAGNOSIS — R0602 Shortness of breath: Secondary | ICD-10-CM | POA: Diagnosis not present

## 2021-03-11 DIAGNOSIS — F32A Depression, unspecified: Secondary | ICD-10-CM | POA: Diagnosis not present

## 2021-03-11 DIAGNOSIS — M1610 Unilateral primary osteoarthritis, unspecified hip: Secondary | ICD-10-CM | POA: Diagnosis not present

## 2021-03-11 DIAGNOSIS — Z9181 History of falling: Secondary | ICD-10-CM | POA: Diagnosis not present

## 2021-03-11 DIAGNOSIS — G4700 Insomnia, unspecified: Secondary | ICD-10-CM | POA: Diagnosis not present

## 2021-03-11 DIAGNOSIS — M170 Bilateral primary osteoarthritis of knee: Secondary | ICD-10-CM | POA: Diagnosis not present

## 2021-03-11 DIAGNOSIS — E785 Hyperlipidemia, unspecified: Secondary | ICD-10-CM | POA: Diagnosis not present

## 2021-03-11 DIAGNOSIS — R600 Localized edema: Secondary | ICD-10-CM | POA: Diagnosis not present

## 2021-03-11 DIAGNOSIS — D649 Anemia, unspecified: Secondary | ICD-10-CM | POA: Diagnosis not present

## 2021-03-15 DIAGNOSIS — J4550 Severe persistent asthma, uncomplicated: Secondary | ICD-10-CM | POA: Diagnosis not present

## 2021-03-15 DIAGNOSIS — J301 Allergic rhinitis due to pollen: Secondary | ICD-10-CM | POA: Diagnosis not present

## 2021-03-15 DIAGNOSIS — R053 Chronic cough: Secondary | ICD-10-CM | POA: Diagnosis not present

## 2021-03-18 DIAGNOSIS — J45909 Unspecified asthma, uncomplicated: Secondary | ICD-10-CM | POA: Diagnosis not present

## 2021-03-18 DIAGNOSIS — H538 Other visual disturbances: Secondary | ICD-10-CM | POA: Diagnosis not present

## 2021-03-18 DIAGNOSIS — F5101 Primary insomnia: Secondary | ICD-10-CM | POA: Diagnosis not present

## 2021-03-18 DIAGNOSIS — R600 Localized edema: Secondary | ICD-10-CM | POA: Diagnosis not present

## 2021-03-21 DIAGNOSIS — J449 Chronic obstructive pulmonary disease, unspecified: Secondary | ICD-10-CM | POA: Diagnosis not present

## 2021-03-23 DIAGNOSIS — I10 Essential (primary) hypertension: Secondary | ICD-10-CM | POA: Diagnosis not present

## 2021-03-23 DIAGNOSIS — R600 Localized edema: Secondary | ICD-10-CM | POA: Diagnosis not present

## 2021-03-23 DIAGNOSIS — Z7901 Long term (current) use of anticoagulants: Secondary | ICD-10-CM | POA: Diagnosis not present

## 2021-03-23 DIAGNOSIS — E782 Mixed hyperlipidemia: Secondary | ICD-10-CM | POA: Diagnosis not present

## 2021-03-23 DIAGNOSIS — I2692 Saddle embolus of pulmonary artery without acute cor pulmonale: Secondary | ICD-10-CM | POA: Diagnosis not present

## 2021-03-23 DIAGNOSIS — I2782 Chronic pulmonary embolism: Secondary | ICD-10-CM | POA: Diagnosis not present

## 2021-03-25 DIAGNOSIS — F32A Depression, unspecified: Secondary | ICD-10-CM | POA: Diagnosis not present

## 2021-03-25 DIAGNOSIS — K219 Gastro-esophageal reflux disease without esophagitis: Secondary | ICD-10-CM | POA: Diagnosis not present

## 2021-03-25 DIAGNOSIS — Z7951 Long term (current) use of inhaled steroids: Secondary | ICD-10-CM | POA: Diagnosis not present

## 2021-03-25 DIAGNOSIS — Z9181 History of falling: Secondary | ICD-10-CM | POA: Diagnosis not present

## 2021-03-25 DIAGNOSIS — G4700 Insomnia, unspecified: Secondary | ICD-10-CM | POA: Diagnosis not present

## 2021-03-25 DIAGNOSIS — E785 Hyperlipidemia, unspecified: Secondary | ICD-10-CM | POA: Diagnosis not present

## 2021-03-25 DIAGNOSIS — M170 Bilateral primary osteoarthritis of knee: Secondary | ICD-10-CM | POA: Diagnosis not present

## 2021-03-25 DIAGNOSIS — D649 Anemia, unspecified: Secondary | ICD-10-CM | POA: Diagnosis not present

## 2021-03-25 DIAGNOSIS — M1610 Unilateral primary osteoarthritis, unspecified hip: Secondary | ICD-10-CM | POA: Diagnosis not present

## 2021-03-25 DIAGNOSIS — I10 Essential (primary) hypertension: Secondary | ICD-10-CM | POA: Diagnosis not present

## 2021-03-30 DIAGNOSIS — J4550 Severe persistent asthma, uncomplicated: Secondary | ICD-10-CM | POA: Diagnosis not present

## 2021-03-30 DIAGNOSIS — R053 Chronic cough: Secondary | ICD-10-CM | POA: Diagnosis not present

## 2021-03-30 DIAGNOSIS — J301 Allergic rhinitis due to pollen: Secondary | ICD-10-CM | POA: Diagnosis not present

## 2021-04-01 DIAGNOSIS — K219 Gastro-esophageal reflux disease without esophagitis: Secondary | ICD-10-CM | POA: Diagnosis not present

## 2021-04-01 DIAGNOSIS — Z7951 Long term (current) use of inhaled steroids: Secondary | ICD-10-CM | POA: Diagnosis not present

## 2021-04-01 DIAGNOSIS — Z9181 History of falling: Secondary | ICD-10-CM | POA: Diagnosis not present

## 2021-04-01 DIAGNOSIS — F32A Depression, unspecified: Secondary | ICD-10-CM | POA: Diagnosis not present

## 2021-04-01 DIAGNOSIS — D649 Anemia, unspecified: Secondary | ICD-10-CM | POA: Diagnosis not present

## 2021-04-01 DIAGNOSIS — M1610 Unilateral primary osteoarthritis, unspecified hip: Secondary | ICD-10-CM | POA: Diagnosis not present

## 2021-04-01 DIAGNOSIS — G4700 Insomnia, unspecified: Secondary | ICD-10-CM | POA: Diagnosis not present

## 2021-04-01 DIAGNOSIS — E785 Hyperlipidemia, unspecified: Secondary | ICD-10-CM | POA: Diagnosis not present

## 2021-04-01 DIAGNOSIS — I10 Essential (primary) hypertension: Secondary | ICD-10-CM | POA: Diagnosis not present

## 2021-04-01 DIAGNOSIS — M170 Bilateral primary osteoarthritis of knee: Secondary | ICD-10-CM | POA: Diagnosis not present

## 2021-04-13 DIAGNOSIS — R440 Auditory hallucinations: Secondary | ICD-10-CM | POA: Diagnosis not present

## 2021-04-13 DIAGNOSIS — F5101 Primary insomnia: Secondary | ICD-10-CM | POA: Diagnosis not present

## 2021-04-13 DIAGNOSIS — F339 Major depressive disorder, recurrent, unspecified: Secondary | ICD-10-CM | POA: Diagnosis not present

## 2021-04-14 DIAGNOSIS — M1610 Unilateral primary osteoarthritis, unspecified hip: Secondary | ICD-10-CM | POA: Diagnosis not present

## 2021-04-14 DIAGNOSIS — D649 Anemia, unspecified: Secondary | ICD-10-CM | POA: Diagnosis not present

## 2021-04-14 DIAGNOSIS — I10 Essential (primary) hypertension: Secondary | ICD-10-CM | POA: Diagnosis not present

## 2021-04-14 DIAGNOSIS — Z9181 History of falling: Secondary | ICD-10-CM | POA: Diagnosis not present

## 2021-04-14 DIAGNOSIS — M170 Bilateral primary osteoarthritis of knee: Secondary | ICD-10-CM | POA: Diagnosis not present

## 2021-04-14 DIAGNOSIS — G4700 Insomnia, unspecified: Secondary | ICD-10-CM | POA: Diagnosis not present

## 2021-04-14 DIAGNOSIS — K219 Gastro-esophageal reflux disease without esophagitis: Secondary | ICD-10-CM | POA: Diagnosis not present

## 2021-04-14 DIAGNOSIS — R0902 Hypoxemia: Secondary | ICD-10-CM | POA: Diagnosis not present

## 2021-04-14 DIAGNOSIS — J4551 Severe persistent asthma with (acute) exacerbation: Secondary | ICD-10-CM | POA: Diagnosis not present

## 2021-04-14 DIAGNOSIS — Z7951 Long term (current) use of inhaled steroids: Secondary | ICD-10-CM | POA: Diagnosis not present

## 2021-04-14 DIAGNOSIS — R0602 Shortness of breath: Secondary | ICD-10-CM | POA: Diagnosis not present

## 2021-04-14 DIAGNOSIS — E785 Hyperlipidemia, unspecified: Secondary | ICD-10-CM | POA: Diagnosis not present

## 2021-04-14 DIAGNOSIS — J439 Emphysema, unspecified: Secondary | ICD-10-CM | POA: Diagnosis not present

## 2021-04-14 DIAGNOSIS — F32A Depression, unspecified: Secondary | ICD-10-CM | POA: Diagnosis not present

## 2021-04-15 DIAGNOSIS — R531 Weakness: Secondary | ICD-10-CM | POA: Diagnosis not present

## 2021-04-15 DIAGNOSIS — Z79899 Other long term (current) drug therapy: Secondary | ICD-10-CM | POA: Diagnosis not present

## 2021-04-15 DIAGNOSIS — J45901 Unspecified asthma with (acute) exacerbation: Secondary | ICD-10-CM | POA: Diagnosis not present

## 2021-04-15 DIAGNOSIS — J9601 Acute respiratory failure with hypoxia: Secondary | ICD-10-CM | POA: Diagnosis not present

## 2021-04-15 DIAGNOSIS — J4551 Severe persistent asthma with (acute) exacerbation: Secondary | ICD-10-CM | POA: Diagnosis not present

## 2021-04-15 DIAGNOSIS — I10 Essential (primary) hypertension: Secondary | ICD-10-CM | POA: Diagnosis not present

## 2021-04-15 DIAGNOSIS — R5381 Other malaise: Secondary | ICD-10-CM | POA: Diagnosis not present

## 2021-04-15 DIAGNOSIS — Z743 Need for continuous supervision: Secondary | ICD-10-CM | POA: Diagnosis not present

## 2021-04-15 DIAGNOSIS — Z9181 History of falling: Secondary | ICD-10-CM | POA: Diagnosis not present

## 2021-04-15 DIAGNOSIS — J439 Emphysema, unspecified: Secondary | ICD-10-CM | POA: Diagnosis not present

## 2021-04-15 DIAGNOSIS — R0902 Hypoxemia: Secondary | ICD-10-CM | POA: Diagnosis not present

## 2021-04-15 DIAGNOSIS — E785 Hyperlipidemia, unspecified: Secondary | ICD-10-CM | POA: Diagnosis not present

## 2021-04-16 DIAGNOSIS — I10 Essential (primary) hypertension: Secondary | ICD-10-CM | POA: Diagnosis not present

## 2021-04-16 DIAGNOSIS — J9601 Acute respiratory failure with hypoxia: Secondary | ICD-10-CM | POA: Diagnosis not present

## 2021-04-16 DIAGNOSIS — J4551 Severe persistent asthma with (acute) exacerbation: Secondary | ICD-10-CM | POA: Diagnosis not present

## 2021-04-17 DIAGNOSIS — J4551 Severe persistent asthma with (acute) exacerbation: Secondary | ICD-10-CM | POA: Diagnosis not present

## 2021-04-17 DIAGNOSIS — J9601 Acute respiratory failure with hypoxia: Secondary | ICD-10-CM | POA: Diagnosis not present

## 2021-04-17 DIAGNOSIS — I10 Essential (primary) hypertension: Secondary | ICD-10-CM | POA: Diagnosis not present

## 2021-04-18 DIAGNOSIS — J4551 Severe persistent asthma with (acute) exacerbation: Secondary | ICD-10-CM | POA: Diagnosis not present

## 2021-04-18 DIAGNOSIS — I10 Essential (primary) hypertension: Secondary | ICD-10-CM | POA: Diagnosis not present

## 2021-04-18 DIAGNOSIS — J9601 Acute respiratory failure with hypoxia: Secondary | ICD-10-CM | POA: Diagnosis not present

## 2021-04-19 DIAGNOSIS — J45901 Unspecified asthma with (acute) exacerbation: Secondary | ICD-10-CM | POA: Diagnosis not present

## 2021-04-19 DIAGNOSIS — J9601 Acute respiratory failure with hypoxia: Secondary | ICD-10-CM | POA: Diagnosis not present

## 2021-04-19 DIAGNOSIS — J4551 Severe persistent asthma with (acute) exacerbation: Secondary | ICD-10-CM | POA: Diagnosis not present

## 2021-04-19 DIAGNOSIS — R531 Weakness: Secondary | ICD-10-CM | POA: Diagnosis not present

## 2021-04-19 DIAGNOSIS — Z743 Need for continuous supervision: Secondary | ICD-10-CM | POA: Diagnosis not present

## 2021-04-19 DIAGNOSIS — R5381 Other malaise: Secondary | ICD-10-CM | POA: Diagnosis not present

## 2021-04-19 DIAGNOSIS — I10 Essential (primary) hypertension: Secondary | ICD-10-CM | POA: Diagnosis not present

## 2021-04-21 DIAGNOSIS — J449 Chronic obstructive pulmonary disease, unspecified: Secondary | ICD-10-CM | POA: Diagnosis not present

## 2021-04-22 DIAGNOSIS — I10 Essential (primary) hypertension: Secondary | ICD-10-CM | POA: Diagnosis not present

## 2021-04-22 DIAGNOSIS — M6281 Muscle weakness (generalized): Secondary | ICD-10-CM | POA: Diagnosis not present

## 2021-04-22 DIAGNOSIS — J45901 Unspecified asthma with (acute) exacerbation: Secondary | ICD-10-CM | POA: Diagnosis not present

## 2021-04-22 DIAGNOSIS — R2681 Unsteadiness on feet: Secondary | ICD-10-CM | POA: Diagnosis not present

## 2021-04-29 DIAGNOSIS — R600 Localized edema: Secondary | ICD-10-CM | POA: Diagnosis not present

## 2021-04-29 DIAGNOSIS — I2692 Saddle embolus of pulmonary artery without acute cor pulmonale: Secondary | ICD-10-CM | POA: Diagnosis not present

## 2021-04-29 DIAGNOSIS — I2782 Chronic pulmonary embolism: Secondary | ICD-10-CM | POA: Diagnosis not present

## 2021-04-30 DIAGNOSIS — F32A Depression, unspecified: Secondary | ICD-10-CM | POA: Diagnosis not present

## 2021-04-30 DIAGNOSIS — K219 Gastro-esophageal reflux disease without esophagitis: Secondary | ICD-10-CM | POA: Diagnosis not present

## 2021-04-30 DIAGNOSIS — Z9181 History of falling: Secondary | ICD-10-CM | POA: Diagnosis not present

## 2021-04-30 DIAGNOSIS — D649 Anemia, unspecified: Secondary | ICD-10-CM | POA: Diagnosis not present

## 2021-04-30 DIAGNOSIS — I10 Essential (primary) hypertension: Secondary | ICD-10-CM | POA: Diagnosis not present

## 2021-04-30 DIAGNOSIS — M1610 Unilateral primary osteoarthritis, unspecified hip: Secondary | ICD-10-CM | POA: Diagnosis not present

## 2021-04-30 DIAGNOSIS — M170 Bilateral primary osteoarthritis of knee: Secondary | ICD-10-CM | POA: Diagnosis not present

## 2021-04-30 DIAGNOSIS — G4700 Insomnia, unspecified: Secondary | ICD-10-CM | POA: Diagnosis not present

## 2021-04-30 DIAGNOSIS — E785 Hyperlipidemia, unspecified: Secondary | ICD-10-CM | POA: Diagnosis not present

## 2021-04-30 DIAGNOSIS — Z7951 Long term (current) use of inhaled steroids: Secondary | ICD-10-CM | POA: Diagnosis not present

## 2021-05-04 DIAGNOSIS — J45909 Unspecified asthma, uncomplicated: Secondary | ICD-10-CM | POA: Diagnosis not present

## 2021-05-04 DIAGNOSIS — M170 Bilateral primary osteoarthritis of knee: Secondary | ICD-10-CM | POA: Diagnosis not present

## 2021-05-04 DIAGNOSIS — I10 Essential (primary) hypertension: Secondary | ICD-10-CM | POA: Diagnosis not present

## 2021-05-04 DIAGNOSIS — M1610 Unilateral primary osteoarthritis, unspecified hip: Secondary | ICD-10-CM | POA: Diagnosis not present

## 2021-05-06 DIAGNOSIS — M1610 Unilateral primary osteoarthritis, unspecified hip: Secondary | ICD-10-CM | POA: Diagnosis not present

## 2021-05-06 DIAGNOSIS — Z9181 History of falling: Secondary | ICD-10-CM | POA: Diagnosis not present

## 2021-05-06 DIAGNOSIS — M170 Bilateral primary osteoarthritis of knee: Secondary | ICD-10-CM | POA: Diagnosis not present

## 2021-05-06 DIAGNOSIS — J45909 Unspecified asthma, uncomplicated: Secondary | ICD-10-CM | POA: Diagnosis not present

## 2021-05-06 DIAGNOSIS — Z9981 Dependence on supplemental oxygen: Secondary | ICD-10-CM | POA: Diagnosis not present

## 2021-05-06 DIAGNOSIS — F32A Depression, unspecified: Secondary | ICD-10-CM | POA: Diagnosis not present

## 2021-05-06 DIAGNOSIS — I10 Essential (primary) hypertension: Secondary | ICD-10-CM | POA: Diagnosis not present

## 2021-05-06 DIAGNOSIS — G4700 Insomnia, unspecified: Secondary | ICD-10-CM | POA: Diagnosis not present

## 2021-05-06 DIAGNOSIS — K219 Gastro-esophageal reflux disease without esophagitis: Secondary | ICD-10-CM | POA: Diagnosis not present

## 2021-05-06 DIAGNOSIS — Z7951 Long term (current) use of inhaled steroids: Secondary | ICD-10-CM | POA: Diagnosis not present

## 2021-05-06 DIAGNOSIS — D649 Anemia, unspecified: Secondary | ICD-10-CM | POA: Diagnosis not present

## 2021-05-06 DIAGNOSIS — E785 Hyperlipidemia, unspecified: Secondary | ICD-10-CM | POA: Diagnosis not present

## 2021-05-13 DIAGNOSIS — Z9981 Dependence on supplemental oxygen: Secondary | ICD-10-CM | POA: Diagnosis not present

## 2021-05-13 DIAGNOSIS — F32A Depression, unspecified: Secondary | ICD-10-CM | POA: Diagnosis not present

## 2021-05-13 DIAGNOSIS — M1610 Unilateral primary osteoarthritis, unspecified hip: Secondary | ICD-10-CM | POA: Diagnosis not present

## 2021-05-13 DIAGNOSIS — M170 Bilateral primary osteoarthritis of knee: Secondary | ICD-10-CM | POA: Diagnosis not present

## 2021-05-13 DIAGNOSIS — I10 Essential (primary) hypertension: Secondary | ICD-10-CM | POA: Diagnosis not present

## 2021-05-13 DIAGNOSIS — J45909 Unspecified asthma, uncomplicated: Secondary | ICD-10-CM | POA: Diagnosis not present

## 2021-05-13 DIAGNOSIS — I7091 Generalized atherosclerosis: Secondary | ICD-10-CM | POA: Diagnosis not present

## 2021-05-13 DIAGNOSIS — Z9181 History of falling: Secondary | ICD-10-CM | POA: Diagnosis not present

## 2021-05-13 DIAGNOSIS — K219 Gastro-esophageal reflux disease without esophagitis: Secondary | ICD-10-CM | POA: Diagnosis not present

## 2021-05-13 DIAGNOSIS — G4700 Insomnia, unspecified: Secondary | ICD-10-CM | POA: Diagnosis not present

## 2021-05-13 DIAGNOSIS — Z7951 Long term (current) use of inhaled steroids: Secondary | ICD-10-CM | POA: Diagnosis not present

## 2021-05-13 DIAGNOSIS — D649 Anemia, unspecified: Secondary | ICD-10-CM | POA: Diagnosis not present

## 2021-05-13 DIAGNOSIS — E785 Hyperlipidemia, unspecified: Secondary | ICD-10-CM | POA: Diagnosis not present

## 2021-05-19 DIAGNOSIS — J45901 Unspecified asthma with (acute) exacerbation: Secondary | ICD-10-CM | POA: Diagnosis not present

## 2021-05-20 DIAGNOSIS — F339 Major depressive disorder, recurrent, unspecified: Secondary | ICD-10-CM | POA: Diagnosis not present

## 2021-05-20 DIAGNOSIS — F5101 Primary insomnia: Secondary | ICD-10-CM | POA: Diagnosis not present

## 2021-05-20 DIAGNOSIS — R440 Auditory hallucinations: Secondary | ICD-10-CM | POA: Diagnosis not present

## 2021-05-21 DIAGNOSIS — J449 Chronic obstructive pulmonary disease, unspecified: Secondary | ICD-10-CM | POA: Diagnosis not present

## 2021-05-25 DIAGNOSIS — I2692 Saddle embolus of pulmonary artery without acute cor pulmonale: Secondary | ICD-10-CM | POA: Diagnosis not present

## 2021-05-25 DIAGNOSIS — E782 Mixed hyperlipidemia: Secondary | ICD-10-CM | POA: Diagnosis not present

## 2021-05-25 DIAGNOSIS — I119 Hypertensive heart disease without heart failure: Secondary | ICD-10-CM | POA: Diagnosis not present

## 2021-05-25 DIAGNOSIS — Z7901 Long term (current) use of anticoagulants: Secondary | ICD-10-CM | POA: Diagnosis not present

## 2021-05-25 DIAGNOSIS — R600 Localized edema: Secondary | ICD-10-CM | POA: Diagnosis not present

## 2021-05-26 DIAGNOSIS — I10 Essential (primary) hypertension: Secondary | ICD-10-CM | POA: Diagnosis not present

## 2021-05-26 DIAGNOSIS — Z7951 Long term (current) use of inhaled steroids: Secondary | ICD-10-CM | POA: Diagnosis not present

## 2021-05-26 DIAGNOSIS — E785 Hyperlipidemia, unspecified: Secondary | ICD-10-CM | POA: Diagnosis not present

## 2021-05-26 DIAGNOSIS — F32A Depression, unspecified: Secondary | ICD-10-CM | POA: Diagnosis not present

## 2021-05-26 DIAGNOSIS — Z9981 Dependence on supplemental oxygen: Secondary | ICD-10-CM | POA: Diagnosis not present

## 2021-05-26 DIAGNOSIS — M170 Bilateral primary osteoarthritis of knee: Secondary | ICD-10-CM | POA: Diagnosis not present

## 2021-05-26 DIAGNOSIS — K219 Gastro-esophageal reflux disease without esophagitis: Secondary | ICD-10-CM | POA: Diagnosis not present

## 2021-05-26 DIAGNOSIS — G4700 Insomnia, unspecified: Secondary | ICD-10-CM | POA: Diagnosis not present

## 2021-05-26 DIAGNOSIS — M1610 Unilateral primary osteoarthritis, unspecified hip: Secondary | ICD-10-CM | POA: Diagnosis not present

## 2021-05-26 DIAGNOSIS — Z9181 History of falling: Secondary | ICD-10-CM | POA: Diagnosis not present

## 2021-05-26 DIAGNOSIS — J45909 Unspecified asthma, uncomplicated: Secondary | ICD-10-CM | POA: Diagnosis not present

## 2021-05-26 DIAGNOSIS — D649 Anemia, unspecified: Secondary | ICD-10-CM | POA: Diagnosis not present

## 2021-05-27 DIAGNOSIS — Z86711 Personal history of pulmonary embolism: Secondary | ICD-10-CM | POA: Diagnosis not present

## 2021-05-27 DIAGNOSIS — R6889 Other general symptoms and signs: Secondary | ICD-10-CM | POA: Diagnosis not present

## 2021-05-27 DIAGNOSIS — R600 Localized edema: Secondary | ICD-10-CM | POA: Diagnosis not present

## 2021-05-27 DIAGNOSIS — R208 Other disturbances of skin sensation: Secondary | ICD-10-CM | POA: Diagnosis not present

## 2021-06-02 DIAGNOSIS — Z79899 Other long term (current) drug therapy: Secondary | ICD-10-CM | POA: Diagnosis not present

## 2021-06-02 DIAGNOSIS — E038 Other specified hypothyroidism: Secondary | ICD-10-CM | POA: Diagnosis not present

## 2021-06-10 DIAGNOSIS — Z7951 Long term (current) use of inhaled steroids: Secondary | ICD-10-CM | POA: Diagnosis not present

## 2021-06-10 DIAGNOSIS — K219 Gastro-esophageal reflux disease without esophagitis: Secondary | ICD-10-CM | POA: Diagnosis not present

## 2021-06-10 DIAGNOSIS — I10 Essential (primary) hypertension: Secondary | ICD-10-CM | POA: Diagnosis not present

## 2021-06-10 DIAGNOSIS — G4700 Insomnia, unspecified: Secondary | ICD-10-CM | POA: Diagnosis not present

## 2021-06-10 DIAGNOSIS — Z9981 Dependence on supplemental oxygen: Secondary | ICD-10-CM | POA: Diagnosis not present

## 2021-06-10 DIAGNOSIS — F32A Depression, unspecified: Secondary | ICD-10-CM | POA: Diagnosis not present

## 2021-06-10 DIAGNOSIS — E785 Hyperlipidemia, unspecified: Secondary | ICD-10-CM | POA: Diagnosis not present

## 2021-06-10 DIAGNOSIS — M170 Bilateral primary osteoarthritis of knee: Secondary | ICD-10-CM | POA: Diagnosis not present

## 2021-06-10 DIAGNOSIS — D649 Anemia, unspecified: Secondary | ICD-10-CM | POA: Diagnosis not present

## 2021-06-10 DIAGNOSIS — J45909 Unspecified asthma, uncomplicated: Secondary | ICD-10-CM | POA: Diagnosis not present

## 2021-06-10 DIAGNOSIS — M1610 Unilateral primary osteoarthritis, unspecified hip: Secondary | ICD-10-CM | POA: Diagnosis not present

## 2021-06-10 DIAGNOSIS — Z9181 History of falling: Secondary | ICD-10-CM | POA: Diagnosis not present

## 2021-06-15 DIAGNOSIS — F5101 Primary insomnia: Secondary | ICD-10-CM | POA: Diagnosis not present

## 2021-06-15 DIAGNOSIS — F339 Major depressive disorder, recurrent, unspecified: Secondary | ICD-10-CM | POA: Diagnosis not present

## 2021-06-15 DIAGNOSIS — R440 Auditory hallucinations: Secondary | ICD-10-CM | POA: Diagnosis not present

## 2021-06-19 DIAGNOSIS — J45901 Unspecified asthma with (acute) exacerbation: Secondary | ICD-10-CM | POA: Diagnosis not present

## 2021-06-19 DIAGNOSIS — I509 Heart failure, unspecified: Secondary | ICD-10-CM | POA: Diagnosis not present

## 2021-06-19 DIAGNOSIS — I517 Cardiomegaly: Secondary | ICD-10-CM | POA: Diagnosis not present

## 2021-06-19 DIAGNOSIS — J810 Acute pulmonary edema: Secondary | ICD-10-CM | POA: Diagnosis not present

## 2021-06-21 DIAGNOSIS — J449 Chronic obstructive pulmonary disease, unspecified: Secondary | ICD-10-CM | POA: Diagnosis not present

## 2021-06-30 DIAGNOSIS — J301 Allergic rhinitis due to pollen: Secondary | ICD-10-CM | POA: Diagnosis not present

## 2021-06-30 DIAGNOSIS — R053 Chronic cough: Secondary | ICD-10-CM | POA: Diagnosis not present

## 2021-06-30 DIAGNOSIS — J4550 Severe persistent asthma, uncomplicated: Secondary | ICD-10-CM | POA: Diagnosis not present

## 2021-07-09 DIAGNOSIS — F339 Major depressive disorder, recurrent, unspecified: Secondary | ICD-10-CM | POA: Diagnosis not present

## 2021-07-09 DIAGNOSIS — F5101 Primary insomnia: Secondary | ICD-10-CM | POA: Diagnosis not present

## 2021-07-09 DIAGNOSIS — R440 Auditory hallucinations: Secondary | ICD-10-CM | POA: Diagnosis not present

## 2021-07-19 DIAGNOSIS — J45901 Unspecified asthma with (acute) exacerbation: Secondary | ICD-10-CM | POA: Diagnosis not present

## 2021-07-20 DIAGNOSIS — L603 Nail dystrophy: Secondary | ICD-10-CM | POA: Diagnosis not present

## 2021-07-20 DIAGNOSIS — I7091 Generalized atherosclerosis: Secondary | ICD-10-CM | POA: Diagnosis not present

## 2021-07-21 DIAGNOSIS — J449 Chronic obstructive pulmonary disease, unspecified: Secondary | ICD-10-CM | POA: Diagnosis not present

## 2021-07-29 DIAGNOSIS — M797 Fibromyalgia: Secondary | ICD-10-CM | POA: Diagnosis not present

## 2021-07-29 DIAGNOSIS — I10 Essential (primary) hypertension: Secondary | ICD-10-CM | POA: Diagnosis not present

## 2021-07-29 DIAGNOSIS — I509 Heart failure, unspecified: Secondary | ICD-10-CM | POA: Diagnosis not present

## 2021-07-29 DIAGNOSIS — E782 Mixed hyperlipidemia: Secondary | ICD-10-CM | POA: Diagnosis not present

## 2021-08-18 DIAGNOSIS — R440 Auditory hallucinations: Secondary | ICD-10-CM | POA: Diagnosis not present

## 2021-08-18 DIAGNOSIS — F339 Major depressive disorder, recurrent, unspecified: Secondary | ICD-10-CM | POA: Diagnosis not present

## 2021-08-18 DIAGNOSIS — F5101 Primary insomnia: Secondary | ICD-10-CM | POA: Diagnosis not present

## 2021-08-19 DIAGNOSIS — J45901 Unspecified asthma with (acute) exacerbation: Secondary | ICD-10-CM | POA: Diagnosis not present

## 2021-08-21 DIAGNOSIS — J449 Chronic obstructive pulmonary disease, unspecified: Secondary | ICD-10-CM | POA: Diagnosis not present

## 2021-09-09 DIAGNOSIS — B079 Viral wart, unspecified: Secondary | ICD-10-CM | POA: Diagnosis not present

## 2021-09-15 DIAGNOSIS — F5101 Primary insomnia: Secondary | ICD-10-CM | POA: Diagnosis not present

## 2021-09-15 DIAGNOSIS — F339 Major depressive disorder, recurrent, unspecified: Secondary | ICD-10-CM | POA: Diagnosis not present

## 2021-09-15 DIAGNOSIS — R440 Auditory hallucinations: Secondary | ICD-10-CM | POA: Diagnosis not present

## 2021-09-19 DIAGNOSIS — J45901 Unspecified asthma with (acute) exacerbation: Secondary | ICD-10-CM | POA: Diagnosis not present

## 2021-09-20 DIAGNOSIS — I7091 Generalized atherosclerosis: Secondary | ICD-10-CM | POA: Diagnosis not present

## 2021-09-21 DIAGNOSIS — J449 Chronic obstructive pulmonary disease, unspecified: Secondary | ICD-10-CM | POA: Diagnosis not present

## 2021-09-30 DIAGNOSIS — K649 Unspecified hemorrhoids: Secondary | ICD-10-CM | POA: Diagnosis not present

## 2021-09-30 DIAGNOSIS — R52 Pain, unspecified: Secondary | ICD-10-CM | POA: Diagnosis not present

## 2021-09-30 DIAGNOSIS — R54 Age-related physical debility: Secondary | ICD-10-CM | POA: Diagnosis not present

## 2021-10-17 DIAGNOSIS — J45901 Unspecified asthma with (acute) exacerbation: Secondary | ICD-10-CM | POA: Diagnosis not present

## 2021-10-19 DIAGNOSIS — J449 Chronic obstructive pulmonary disease, unspecified: Secondary | ICD-10-CM | POA: Diagnosis not present

## 2021-10-20 DIAGNOSIS — F5101 Primary insomnia: Secondary | ICD-10-CM | POA: Diagnosis not present

## 2021-10-20 DIAGNOSIS — F339 Major depressive disorder, recurrent, unspecified: Secondary | ICD-10-CM | POA: Diagnosis not present

## 2021-10-20 DIAGNOSIS — R440 Auditory hallucinations: Secondary | ICD-10-CM | POA: Diagnosis not present

## 2021-10-26 DIAGNOSIS — J301 Allergic rhinitis due to pollen: Secondary | ICD-10-CM | POA: Diagnosis not present

## 2021-10-26 DIAGNOSIS — R053 Chronic cough: Secondary | ICD-10-CM | POA: Diagnosis not present

## 2021-10-26 DIAGNOSIS — J4550 Severe persistent asthma, uncomplicated: Secondary | ICD-10-CM | POA: Diagnosis not present

## 2021-11-03 DIAGNOSIS — R440 Auditory hallucinations: Secondary | ICD-10-CM | POA: Diagnosis not present

## 2021-11-03 DIAGNOSIS — F339 Major depressive disorder, recurrent, unspecified: Secondary | ICD-10-CM | POA: Diagnosis not present

## 2021-11-03 DIAGNOSIS — F5101 Primary insomnia: Secondary | ICD-10-CM | POA: Diagnosis not present

## 2021-11-04 DIAGNOSIS — F5101 Primary insomnia: Secondary | ICD-10-CM | POA: Diagnosis not present

## 2021-11-04 DIAGNOSIS — J4520 Mild intermittent asthma, uncomplicated: Secondary | ICD-10-CM | POA: Diagnosis not present

## 2021-11-04 DIAGNOSIS — I5042 Chronic combined systolic (congestive) and diastolic (congestive) heart failure: Secondary | ICD-10-CM | POA: Diagnosis not present

## 2021-11-04 DIAGNOSIS — I10 Essential (primary) hypertension: Secondary | ICD-10-CM | POA: Diagnosis not present

## 2021-11-09 DIAGNOSIS — H26493 Other secondary cataract, bilateral: Secondary | ICD-10-CM | POA: Diagnosis not present

## 2021-11-09 DIAGNOSIS — H26491 Other secondary cataract, right eye: Secondary | ICD-10-CM | POA: Diagnosis not present

## 2021-11-17 DIAGNOSIS — J45901 Unspecified asthma with (acute) exacerbation: Secondary | ICD-10-CM | POA: Diagnosis not present

## 2021-11-18 DIAGNOSIS — N390 Urinary tract infection, site not specified: Secondary | ICD-10-CM | POA: Diagnosis not present

## 2021-11-19 DIAGNOSIS — J449 Chronic obstructive pulmonary disease, unspecified: Secondary | ICD-10-CM | POA: Diagnosis not present

## 2021-11-22 DIAGNOSIS — I7091 Generalized atherosclerosis: Secondary | ICD-10-CM | POA: Diagnosis not present

## 2021-12-15 DIAGNOSIS — R440 Auditory hallucinations: Secondary | ICD-10-CM | POA: Diagnosis not present

## 2021-12-15 DIAGNOSIS — F5101 Primary insomnia: Secondary | ICD-10-CM | POA: Diagnosis not present

## 2021-12-15 DIAGNOSIS — F339 Major depressive disorder, recurrent, unspecified: Secondary | ICD-10-CM | POA: Diagnosis not present

## 2021-12-17 DIAGNOSIS — J45901 Unspecified asthma with (acute) exacerbation: Secondary | ICD-10-CM | POA: Diagnosis not present

## 2021-12-19 DIAGNOSIS — J449 Chronic obstructive pulmonary disease, unspecified: Secondary | ICD-10-CM | POA: Diagnosis not present

## 2022-01-06 DIAGNOSIS — H6123 Impacted cerumen, bilateral: Secondary | ICD-10-CM | POA: Diagnosis not present

## 2022-01-06 DIAGNOSIS — F419 Anxiety disorder, unspecified: Secondary | ICD-10-CM | POA: Diagnosis not present

## 2022-01-06 DIAGNOSIS — R600 Localized edema: Secondary | ICD-10-CM | POA: Diagnosis not present

## 2022-01-11 DIAGNOSIS — R440 Auditory hallucinations: Secondary | ICD-10-CM | POA: Diagnosis not present

## 2022-01-11 DIAGNOSIS — F5101 Primary insomnia: Secondary | ICD-10-CM | POA: Diagnosis not present

## 2022-01-11 DIAGNOSIS — F339 Major depressive disorder, recurrent, unspecified: Secondary | ICD-10-CM | POA: Diagnosis not present

## 2022-01-17 DIAGNOSIS — J45901 Unspecified asthma with (acute) exacerbation: Secondary | ICD-10-CM | POA: Diagnosis not present

## 2022-01-19 DIAGNOSIS — J449 Chronic obstructive pulmonary disease, unspecified: Secondary | ICD-10-CM | POA: Diagnosis not present

## 2022-01-31 DIAGNOSIS — M2041 Other hammer toe(s) (acquired), right foot: Secondary | ICD-10-CM | POA: Diagnosis not present

## 2022-01-31 DIAGNOSIS — B351 Tinea unguium: Secondary | ICD-10-CM | POA: Diagnosis not present

## 2022-01-31 DIAGNOSIS — I7091 Generalized atherosclerosis: Secondary | ICD-10-CM | POA: Diagnosis not present

## 2022-01-31 DIAGNOSIS — M2042 Other hammer toe(s) (acquired), left foot: Secondary | ICD-10-CM | POA: Diagnosis not present

## 2022-02-03 DIAGNOSIS — I509 Heart failure, unspecified: Secondary | ICD-10-CM | POA: Diagnosis not present

## 2022-02-03 DIAGNOSIS — E782 Mixed hyperlipidemia: Secondary | ICD-10-CM | POA: Diagnosis not present

## 2022-02-03 DIAGNOSIS — E876 Hypokalemia: Secondary | ICD-10-CM | POA: Diagnosis not present

## 2022-02-03 DIAGNOSIS — I10 Essential (primary) hypertension: Secondary | ICD-10-CM | POA: Diagnosis not present

## 2022-02-08 DIAGNOSIS — Z79899 Other long term (current) drug therapy: Secondary | ICD-10-CM | POA: Diagnosis not present

## 2022-02-08 DIAGNOSIS — R053 Chronic cough: Secondary | ICD-10-CM | POA: Diagnosis not present

## 2022-02-08 DIAGNOSIS — J45909 Unspecified asthma, uncomplicated: Secondary | ICD-10-CM | POA: Diagnosis not present

## 2022-02-08 DIAGNOSIS — E7800 Pure hypercholesterolemia, unspecified: Secondary | ICD-10-CM | POA: Diagnosis not present

## 2022-02-08 DIAGNOSIS — I10 Essential (primary) hypertension: Secondary | ICD-10-CM | POA: Diagnosis not present

## 2022-02-08 DIAGNOSIS — Z9981 Dependence on supplemental oxygen: Secondary | ICD-10-CM | POA: Diagnosis not present

## 2022-02-08 DIAGNOSIS — J301 Allergic rhinitis due to pollen: Secondary | ICD-10-CM | POA: Diagnosis not present

## 2022-02-08 DIAGNOSIS — Z9181 History of falling: Secondary | ICD-10-CM | POA: Diagnosis not present

## 2022-02-08 DIAGNOSIS — F32A Depression, unspecified: Secondary | ICD-10-CM | POA: Diagnosis not present

## 2022-02-08 DIAGNOSIS — K219 Gastro-esophageal reflux disease without esophagitis: Secondary | ICD-10-CM | POA: Diagnosis not present

## 2022-02-08 DIAGNOSIS — Z7951 Long term (current) use of inhaled steroids: Secondary | ICD-10-CM | POA: Diagnosis not present

## 2022-02-08 DIAGNOSIS — G4700 Insomnia, unspecified: Secondary | ICD-10-CM | POA: Diagnosis not present

## 2022-02-08 DIAGNOSIS — J4550 Severe persistent asthma, uncomplicated: Secondary | ICD-10-CM | POA: Diagnosis not present

## 2022-02-08 DIAGNOSIS — Z79891 Long term (current) use of opiate analgesic: Secondary | ICD-10-CM | POA: Diagnosis not present

## 2022-02-08 DIAGNOSIS — M170 Bilateral primary osteoarthritis of knee: Secondary | ICD-10-CM | POA: Diagnosis not present

## 2022-02-08 DIAGNOSIS — Z791 Long term (current) use of non-steroidal anti-inflammatories (NSAID): Secondary | ICD-10-CM | POA: Diagnosis not present

## 2022-02-09 DIAGNOSIS — E119 Type 2 diabetes mellitus without complications: Secondary | ICD-10-CM | POA: Diagnosis not present

## 2022-02-09 DIAGNOSIS — F339 Major depressive disorder, recurrent, unspecified: Secondary | ICD-10-CM | POA: Diagnosis not present

## 2022-02-09 DIAGNOSIS — D518 Other vitamin B12 deficiency anemias: Secondary | ICD-10-CM | POA: Diagnosis not present

## 2022-02-09 DIAGNOSIS — E7849 Other hyperlipidemia: Secondary | ICD-10-CM | POA: Diagnosis not present

## 2022-02-09 DIAGNOSIS — R440 Auditory hallucinations: Secondary | ICD-10-CM | POA: Diagnosis not present

## 2022-02-09 DIAGNOSIS — Z79899 Other long term (current) drug therapy: Secondary | ICD-10-CM | POA: Diagnosis not present

## 2022-02-09 DIAGNOSIS — F5101 Primary insomnia: Secondary | ICD-10-CM | POA: Diagnosis not present

## 2022-02-10 DIAGNOSIS — E559 Vitamin D deficiency, unspecified: Secondary | ICD-10-CM | POA: Diagnosis not present

## 2022-02-10 DIAGNOSIS — H6123 Impacted cerumen, bilateral: Secondary | ICD-10-CM | POA: Diagnosis not present

## 2022-02-10 DIAGNOSIS — N1832 Chronic kidney disease, stage 3b: Secondary | ICD-10-CM | POA: Diagnosis not present

## 2022-02-16 DIAGNOSIS — J45901 Unspecified asthma with (acute) exacerbation: Secondary | ICD-10-CM | POA: Diagnosis not present

## 2022-02-18 DIAGNOSIS — J449 Chronic obstructive pulmonary disease, unspecified: Secondary | ICD-10-CM | POA: Diagnosis not present

## 2022-02-23 DIAGNOSIS — G4700 Insomnia, unspecified: Secondary | ICD-10-CM | POA: Diagnosis not present

## 2022-02-23 DIAGNOSIS — K219 Gastro-esophageal reflux disease without esophagitis: Secondary | ICD-10-CM | POA: Diagnosis not present

## 2022-02-23 DIAGNOSIS — Z79891 Long term (current) use of opiate analgesic: Secondary | ICD-10-CM | POA: Diagnosis not present

## 2022-02-23 DIAGNOSIS — Z79899 Other long term (current) drug therapy: Secondary | ICD-10-CM | POA: Diagnosis not present

## 2022-02-23 DIAGNOSIS — M170 Bilateral primary osteoarthritis of knee: Secondary | ICD-10-CM | POA: Diagnosis not present

## 2022-02-23 DIAGNOSIS — J45909 Unspecified asthma, uncomplicated: Secondary | ICD-10-CM | POA: Diagnosis not present

## 2022-02-23 DIAGNOSIS — E7800 Pure hypercholesterolemia, unspecified: Secondary | ICD-10-CM | POA: Diagnosis not present

## 2022-02-23 DIAGNOSIS — Z791 Long term (current) use of non-steroidal anti-inflammatories (NSAID): Secondary | ICD-10-CM | POA: Diagnosis not present

## 2022-02-23 DIAGNOSIS — Z9181 History of falling: Secondary | ICD-10-CM | POA: Diagnosis not present

## 2022-02-23 DIAGNOSIS — I10 Essential (primary) hypertension: Secondary | ICD-10-CM | POA: Diagnosis not present

## 2022-02-23 DIAGNOSIS — F32A Depression, unspecified: Secondary | ICD-10-CM | POA: Diagnosis not present

## 2022-02-23 DIAGNOSIS — Z7951 Long term (current) use of inhaled steroids: Secondary | ICD-10-CM | POA: Diagnosis not present

## 2022-02-23 DIAGNOSIS — Z9981 Dependence on supplemental oxygen: Secondary | ICD-10-CM | POA: Diagnosis not present

## 2022-03-08 DIAGNOSIS — J449 Chronic obstructive pulmonary disease, unspecified: Secondary | ICD-10-CM | POA: Diagnosis not present

## 2022-03-08 DIAGNOSIS — F419 Anxiety disorder, unspecified: Secondary | ICD-10-CM | POA: Diagnosis not present

## 2022-03-09 DIAGNOSIS — F419 Anxiety disorder, unspecified: Secondary | ICD-10-CM | POA: Diagnosis not present

## 2022-03-09 DIAGNOSIS — F339 Major depressive disorder, recurrent, unspecified: Secondary | ICD-10-CM | POA: Diagnosis not present

## 2022-03-09 DIAGNOSIS — F5101 Primary insomnia: Secondary | ICD-10-CM | POA: Diagnosis not present

## 2022-03-09 DIAGNOSIS — R440 Auditory hallucinations: Secondary | ICD-10-CM | POA: Diagnosis not present

## 2022-03-19 DIAGNOSIS — J45901 Unspecified asthma with (acute) exacerbation: Secondary | ICD-10-CM | POA: Diagnosis not present

## 2022-03-23 DIAGNOSIS — M170 Bilateral primary osteoarthritis of knee: Secondary | ICD-10-CM | POA: Diagnosis not present

## 2022-03-23 DIAGNOSIS — K219 Gastro-esophageal reflux disease without esophagitis: Secondary | ICD-10-CM | POA: Diagnosis not present

## 2022-03-23 DIAGNOSIS — G4700 Insomnia, unspecified: Secondary | ICD-10-CM | POA: Diagnosis not present

## 2022-03-23 DIAGNOSIS — Z791 Long term (current) use of non-steroidal anti-inflammatories (NSAID): Secondary | ICD-10-CM | POA: Diagnosis not present

## 2022-03-23 DIAGNOSIS — Z79891 Long term (current) use of opiate analgesic: Secondary | ICD-10-CM | POA: Diagnosis not present

## 2022-03-23 DIAGNOSIS — Z9181 History of falling: Secondary | ICD-10-CM | POA: Diagnosis not present

## 2022-03-23 DIAGNOSIS — I10 Essential (primary) hypertension: Secondary | ICD-10-CM | POA: Diagnosis not present

## 2022-03-23 DIAGNOSIS — Z79899 Other long term (current) drug therapy: Secondary | ICD-10-CM | POA: Diagnosis not present

## 2022-03-23 DIAGNOSIS — E7800 Pure hypercholesterolemia, unspecified: Secondary | ICD-10-CM | POA: Diagnosis not present

## 2022-03-23 DIAGNOSIS — Z7951 Long term (current) use of inhaled steroids: Secondary | ICD-10-CM | POA: Diagnosis not present

## 2022-03-23 DIAGNOSIS — J45909 Unspecified asthma, uncomplicated: Secondary | ICD-10-CM | POA: Diagnosis not present

## 2022-03-23 DIAGNOSIS — F32A Depression, unspecified: Secondary | ICD-10-CM | POA: Diagnosis not present

## 2022-03-23 DIAGNOSIS — Z9981 Dependence on supplemental oxygen: Secondary | ICD-10-CM | POA: Diagnosis not present

## 2022-04-01 DIAGNOSIS — Z7951 Long term (current) use of inhaled steroids: Secondary | ICD-10-CM | POA: Diagnosis not present

## 2022-04-01 DIAGNOSIS — Z791 Long term (current) use of non-steroidal anti-inflammatories (NSAID): Secondary | ICD-10-CM | POA: Diagnosis not present

## 2022-04-01 DIAGNOSIS — E7800 Pure hypercholesterolemia, unspecified: Secondary | ICD-10-CM | POA: Diagnosis not present

## 2022-04-01 DIAGNOSIS — Z79899 Other long term (current) drug therapy: Secondary | ICD-10-CM | POA: Diagnosis not present

## 2022-04-01 DIAGNOSIS — J45909 Unspecified asthma, uncomplicated: Secondary | ICD-10-CM | POA: Diagnosis not present

## 2022-04-01 DIAGNOSIS — Z79891 Long term (current) use of opiate analgesic: Secondary | ICD-10-CM | POA: Diagnosis not present

## 2022-04-01 DIAGNOSIS — M170 Bilateral primary osteoarthritis of knee: Secondary | ICD-10-CM | POA: Diagnosis not present

## 2022-04-01 DIAGNOSIS — F32A Depression, unspecified: Secondary | ICD-10-CM | POA: Diagnosis not present

## 2022-04-01 DIAGNOSIS — Z9981 Dependence on supplemental oxygen: Secondary | ICD-10-CM | POA: Diagnosis not present

## 2022-04-01 DIAGNOSIS — Z9181 History of falling: Secondary | ICD-10-CM | POA: Diagnosis not present

## 2022-04-01 DIAGNOSIS — I10 Essential (primary) hypertension: Secondary | ICD-10-CM | POA: Diagnosis not present

## 2022-04-01 DIAGNOSIS — G4700 Insomnia, unspecified: Secondary | ICD-10-CM | POA: Diagnosis not present

## 2022-04-01 DIAGNOSIS — K219 Gastro-esophageal reflux disease without esophagitis: Secondary | ICD-10-CM | POA: Diagnosis not present

## 2022-04-05 DIAGNOSIS — F419 Anxiety disorder, unspecified: Secondary | ICD-10-CM | POA: Diagnosis not present

## 2022-04-05 DIAGNOSIS — F5101 Primary insomnia: Secondary | ICD-10-CM | POA: Diagnosis not present

## 2022-04-05 DIAGNOSIS — R440 Auditory hallucinations: Secondary | ICD-10-CM | POA: Diagnosis not present

## 2022-04-05 DIAGNOSIS — F339 Major depressive disorder, recurrent, unspecified: Secondary | ICD-10-CM | POA: Diagnosis not present

## 2022-04-07 DIAGNOSIS — B351 Tinea unguium: Secondary | ICD-10-CM | POA: Diagnosis not present

## 2022-04-07 DIAGNOSIS — I7091 Generalized atherosclerosis: Secondary | ICD-10-CM | POA: Diagnosis not present

## 2022-04-19 DIAGNOSIS — J449 Chronic obstructive pulmonary disease, unspecified: Secondary | ICD-10-CM | POA: Diagnosis not present

## 2022-04-28 DIAGNOSIS — S81812D Laceration without foreign body, left lower leg, subsequent encounter: Secondary | ICD-10-CM | POA: Diagnosis not present

## 2022-04-28 DIAGNOSIS — H938X3 Other specified disorders of ear, bilateral: Secondary | ICD-10-CM | POA: Diagnosis not present

## 2022-05-10 DIAGNOSIS — Z23 Encounter for immunization: Secondary | ICD-10-CM | POA: Diagnosis not present

## 2022-05-10 DIAGNOSIS — R053 Chronic cough: Secondary | ICD-10-CM | POA: Diagnosis not present

## 2022-05-10 DIAGNOSIS — J301 Allergic rhinitis due to pollen: Secondary | ICD-10-CM | POA: Diagnosis not present

## 2022-05-10 DIAGNOSIS — J4550 Severe persistent asthma, uncomplicated: Secondary | ICD-10-CM | POA: Diagnosis not present

## 2022-05-12 DIAGNOSIS — S81812D Laceration without foreign body, left lower leg, subsequent encounter: Secondary | ICD-10-CM | POA: Diagnosis not present

## 2022-05-12 DIAGNOSIS — M19012 Primary osteoarthritis, left shoulder: Secondary | ICD-10-CM | POA: Diagnosis not present

## 2022-05-12 DIAGNOSIS — M25512 Pain in left shoulder: Secondary | ICD-10-CM | POA: Diagnosis not present

## 2022-05-14 DIAGNOSIS — F5101 Primary insomnia: Secondary | ICD-10-CM | POA: Diagnosis not present

## 2022-05-14 DIAGNOSIS — R440 Auditory hallucinations: Secondary | ICD-10-CM | POA: Diagnosis not present

## 2022-05-14 DIAGNOSIS — F339 Major depressive disorder, recurrent, unspecified: Secondary | ICD-10-CM | POA: Diagnosis not present

## 2022-05-14 DIAGNOSIS — F419 Anxiety disorder, unspecified: Secondary | ICD-10-CM | POA: Diagnosis not present

## 2022-05-26 DIAGNOSIS — K5904 Chronic idiopathic constipation: Secondary | ICD-10-CM | POA: Diagnosis not present

## 2022-05-26 DIAGNOSIS — F5101 Primary insomnia: Secondary | ICD-10-CM | POA: Diagnosis not present

## 2022-05-26 DIAGNOSIS — H9191 Unspecified hearing loss, right ear: Secondary | ICD-10-CM | POA: Diagnosis not present

## 2022-05-26 DIAGNOSIS — H6121 Impacted cerumen, right ear: Secondary | ICD-10-CM | POA: Diagnosis not present

## 2022-05-26 DIAGNOSIS — H903 Sensorineural hearing loss, bilateral: Secondary | ICD-10-CM | POA: Diagnosis not present

## 2022-05-26 DIAGNOSIS — E876 Hypokalemia: Secondary | ICD-10-CM | POA: Diagnosis not present

## 2022-05-26 DIAGNOSIS — I10 Essential (primary) hypertension: Secondary | ICD-10-CM | POA: Diagnosis not present

## 2022-05-27 DIAGNOSIS — R062 Wheezing: Secondary | ICD-10-CM | POA: Diagnosis not present

## 2022-05-31 DIAGNOSIS — E782 Mixed hyperlipidemia: Secondary | ICD-10-CM | POA: Diagnosis not present

## 2022-05-31 DIAGNOSIS — Z79899 Other long term (current) drug therapy: Secondary | ICD-10-CM | POA: Diagnosis not present

## 2022-05-31 DIAGNOSIS — E119 Type 2 diabetes mellitus without complications: Secondary | ICD-10-CM | POA: Diagnosis not present

## 2022-05-31 DIAGNOSIS — R440 Auditory hallucinations: Secondary | ICD-10-CM | POA: Diagnosis not present

## 2022-05-31 DIAGNOSIS — F5101 Primary insomnia: Secondary | ICD-10-CM | POA: Diagnosis not present

## 2022-05-31 DIAGNOSIS — D518 Other vitamin B12 deficiency anemias: Secondary | ICD-10-CM | POA: Diagnosis not present

## 2022-05-31 DIAGNOSIS — F411 Generalized anxiety disorder: Secondary | ICD-10-CM | POA: Diagnosis not present

## 2022-05-31 DIAGNOSIS — E559 Vitamin D deficiency, unspecified: Secondary | ICD-10-CM | POA: Diagnosis not present

## 2022-05-31 DIAGNOSIS — F339 Major depressive disorder, recurrent, unspecified: Secondary | ICD-10-CM | POA: Diagnosis not present

## 2022-06-21 DIAGNOSIS — B351 Tinea unguium: Secondary | ICD-10-CM | POA: Diagnosis not present

## 2022-06-21 DIAGNOSIS — I7091 Generalized atherosclerosis: Secondary | ICD-10-CM | POA: Diagnosis not present

## 2022-06-23 DIAGNOSIS — R54 Age-related physical debility: Secondary | ICD-10-CM | POA: Diagnosis not present

## 2022-06-23 DIAGNOSIS — R251 Tremor, unspecified: Secondary | ICD-10-CM | POA: Diagnosis not present

## 2022-06-23 DIAGNOSIS — R600 Localized edema: Secondary | ICD-10-CM | POA: Diagnosis not present

## 2022-06-28 DIAGNOSIS — I517 Cardiomegaly: Secondary | ICD-10-CM | POA: Diagnosis not present

## 2022-06-28 DIAGNOSIS — R1111 Vomiting without nausea: Secondary | ICD-10-CM | POA: Diagnosis not present

## 2022-06-28 DIAGNOSIS — E860 Dehydration: Secondary | ICD-10-CM | POA: Diagnosis not present

## 2022-06-28 DIAGNOSIS — R450 Nervousness: Secondary | ICD-10-CM | POA: Diagnosis not present

## 2022-06-28 DIAGNOSIS — R5383 Other fatigue: Secondary | ICD-10-CM | POA: Diagnosis not present

## 2022-06-28 DIAGNOSIS — R112 Nausea with vomiting, unspecified: Secondary | ICD-10-CM | POA: Diagnosis not present

## 2022-06-28 DIAGNOSIS — I700 Atherosclerosis of aorta: Secondary | ICD-10-CM | POA: Diagnosis not present

## 2022-06-28 DIAGNOSIS — R1084 Generalized abdominal pain: Secondary | ICD-10-CM | POA: Diagnosis not present

## 2022-06-28 DIAGNOSIS — M19012 Primary osteoarthritis, left shoulder: Secondary | ICD-10-CM | POA: Diagnosis not present

## 2022-06-28 DIAGNOSIS — K6389 Other specified diseases of intestine: Secondary | ICD-10-CM | POA: Diagnosis not present

## 2022-06-28 DIAGNOSIS — M19011 Primary osteoarthritis, right shoulder: Secondary | ICD-10-CM | POA: Diagnosis not present

## 2022-06-28 DIAGNOSIS — N281 Cyst of kidney, acquired: Secondary | ICD-10-CM | POA: Diagnosis not present

## 2022-06-28 DIAGNOSIS — I10 Essential (primary) hypertension: Secondary | ICD-10-CM | POA: Diagnosis not present

## 2022-06-28 DIAGNOSIS — R531 Weakness: Secondary | ICD-10-CM | POA: Diagnosis not present

## 2022-06-29 DIAGNOSIS — F32A Depression, unspecified: Secondary | ICD-10-CM | POA: Diagnosis not present

## 2022-06-29 DIAGNOSIS — Z882 Allergy status to sulfonamides status: Secondary | ICD-10-CM | POA: Diagnosis not present

## 2022-06-29 DIAGNOSIS — I517 Cardiomegaly: Secondary | ICD-10-CM | POA: Diagnosis not present

## 2022-06-29 DIAGNOSIS — E860 Dehydration: Secondary | ICD-10-CM | POA: Diagnosis not present

## 2022-06-29 DIAGNOSIS — R112 Nausea with vomiting, unspecified: Secondary | ICD-10-CM | POA: Diagnosis not present

## 2022-06-29 DIAGNOSIS — E876 Hypokalemia: Secondary | ICD-10-CM | POA: Diagnosis not present

## 2022-06-29 DIAGNOSIS — R440 Auditory hallucinations: Secondary | ICD-10-CM | POA: Diagnosis not present

## 2022-06-29 DIAGNOSIS — R1084 Generalized abdominal pain: Secondary | ICD-10-CM | POA: Diagnosis not present

## 2022-06-29 DIAGNOSIS — F03A0 Unspecified dementia, mild, without behavioral disturbance, psychotic disturbance, mood disturbance, and anxiety: Secondary | ICD-10-CM | POA: Diagnosis not present

## 2022-06-29 DIAGNOSIS — Z792 Long term (current) use of antibiotics: Secondary | ICD-10-CM | POA: Diagnosis not present

## 2022-06-29 DIAGNOSIS — R109 Unspecified abdominal pain: Secondary | ICD-10-CM | POA: Diagnosis not present

## 2022-06-29 DIAGNOSIS — Z1152 Encounter for screening for COVID-19: Secondary | ICD-10-CM | POA: Diagnosis not present

## 2022-06-29 DIAGNOSIS — K6389 Other specified diseases of intestine: Secondary | ICD-10-CM | POA: Diagnosis not present

## 2022-06-29 DIAGNOSIS — J45909 Unspecified asthma, uncomplicated: Secondary | ICD-10-CM | POA: Diagnosis not present

## 2022-06-29 DIAGNOSIS — K529 Noninfective gastroenteritis and colitis, unspecified: Secondary | ICD-10-CM | POA: Diagnosis not present

## 2022-06-29 DIAGNOSIS — Z79899 Other long term (current) drug therapy: Secondary | ICD-10-CM | POA: Diagnosis not present

## 2022-06-29 DIAGNOSIS — M19012 Primary osteoarthritis, left shoulder: Secondary | ICD-10-CM | POA: Diagnosis not present

## 2022-06-29 DIAGNOSIS — I700 Atherosclerosis of aorta: Secondary | ICD-10-CM | POA: Diagnosis not present

## 2022-06-29 DIAGNOSIS — F419 Anxiety disorder, unspecified: Secondary | ICD-10-CM | POA: Diagnosis not present

## 2022-06-29 DIAGNOSIS — E7800 Pure hypercholesterolemia, unspecified: Secondary | ICD-10-CM | POA: Diagnosis not present

## 2022-06-29 DIAGNOSIS — M19011 Primary osteoarthritis, right shoulder: Secondary | ICD-10-CM | POA: Diagnosis not present

## 2022-06-29 DIAGNOSIS — M1990 Unspecified osteoarthritis, unspecified site: Secondary | ICD-10-CM | POA: Diagnosis not present

## 2022-06-29 DIAGNOSIS — F411 Generalized anxiety disorder: Secondary | ICD-10-CM | POA: Diagnosis not present

## 2022-06-29 DIAGNOSIS — F5101 Primary insomnia: Secondary | ICD-10-CM | POA: Diagnosis not present

## 2022-06-29 DIAGNOSIS — N281 Cyst of kidney, acquired: Secondary | ICD-10-CM | POA: Diagnosis not present

## 2022-06-29 DIAGNOSIS — I10 Essential (primary) hypertension: Secondary | ICD-10-CM | POA: Diagnosis not present

## 2022-06-29 DIAGNOSIS — F331 Major depressive disorder, recurrent, moderate: Secondary | ICD-10-CM | POA: Diagnosis not present

## 2022-06-30 DIAGNOSIS — E876 Hypokalemia: Secondary | ICD-10-CM | POA: Diagnosis not present

## 2022-06-30 DIAGNOSIS — R440 Auditory hallucinations: Secondary | ICD-10-CM | POA: Diagnosis not present

## 2022-06-30 DIAGNOSIS — R109 Unspecified abdominal pain: Secondary | ICD-10-CM | POA: Diagnosis not present

## 2022-06-30 DIAGNOSIS — F5101 Primary insomnia: Secondary | ICD-10-CM | POA: Diagnosis not present

## 2022-06-30 DIAGNOSIS — K529 Noninfective gastroenteritis and colitis, unspecified: Secondary | ICD-10-CM | POA: Diagnosis not present

## 2022-06-30 DIAGNOSIS — F331 Major depressive disorder, recurrent, moderate: Secondary | ICD-10-CM | POA: Diagnosis not present

## 2022-06-30 DIAGNOSIS — F411 Generalized anxiety disorder: Secondary | ICD-10-CM | POA: Diagnosis not present

## 2022-06-30 DIAGNOSIS — I10 Essential (primary) hypertension: Secondary | ICD-10-CM | POA: Diagnosis not present

## 2022-07-01 DIAGNOSIS — Z95828 Presence of other vascular implants and grafts: Secondary | ICD-10-CM | POA: Diagnosis not present

## 2022-07-01 DIAGNOSIS — I700 Atherosclerosis of aorta: Secondary | ICD-10-CM | POA: Diagnosis not present

## 2022-07-01 DIAGNOSIS — Z9181 History of falling: Secondary | ICD-10-CM | POA: Diagnosis not present

## 2022-07-01 DIAGNOSIS — F03A4 Unspecified dementia, mild, with anxiety: Secondary | ICD-10-CM | POA: Diagnosis not present

## 2022-07-01 DIAGNOSIS — J45909 Unspecified asthma, uncomplicated: Secondary | ICD-10-CM | POA: Diagnosis not present

## 2022-07-01 DIAGNOSIS — Z791 Long term (current) use of non-steroidal anti-inflammatories (NSAID): Secondary | ICD-10-CM | POA: Diagnosis not present

## 2022-07-01 DIAGNOSIS — K219 Gastro-esophageal reflux disease without esophagitis: Secondary | ICD-10-CM | POA: Diagnosis not present

## 2022-07-01 DIAGNOSIS — K529 Noninfective gastroenteritis and colitis, unspecified: Secondary | ICD-10-CM | POA: Diagnosis not present

## 2022-07-01 DIAGNOSIS — D649 Anemia, unspecified: Secondary | ICD-10-CM | POA: Diagnosis not present

## 2022-07-01 DIAGNOSIS — F03A3 Unspecified dementia, mild, with mood disturbance: Secondary | ICD-10-CM | POA: Diagnosis not present

## 2022-07-01 DIAGNOSIS — Z792 Long term (current) use of antibiotics: Secondary | ICD-10-CM | POA: Diagnosis not present

## 2022-07-01 DIAGNOSIS — F32A Depression, unspecified: Secondary | ICD-10-CM | POA: Diagnosis not present

## 2022-07-01 DIAGNOSIS — Z9981 Dependence on supplemental oxygen: Secondary | ICD-10-CM | POA: Diagnosis not present

## 2022-07-01 DIAGNOSIS — E7800 Pure hypercholesterolemia, unspecified: Secondary | ICD-10-CM | POA: Diagnosis not present

## 2022-07-01 DIAGNOSIS — M151 Heberden's nodes (with arthropathy): Secondary | ICD-10-CM | POA: Diagnosis not present

## 2022-07-01 DIAGNOSIS — Z7951 Long term (current) use of inhaled steroids: Secondary | ICD-10-CM | POA: Diagnosis not present

## 2022-07-01 DIAGNOSIS — Z79899 Other long term (current) drug therapy: Secondary | ICD-10-CM | POA: Diagnosis not present

## 2022-07-01 DIAGNOSIS — G4700 Insomnia, unspecified: Secondary | ICD-10-CM | POA: Diagnosis not present

## 2022-07-01 DIAGNOSIS — I10 Essential (primary) hypertension: Secondary | ICD-10-CM | POA: Diagnosis not present

## 2022-07-07 DIAGNOSIS — M1990 Unspecified osteoarthritis, unspecified site: Secondary | ICD-10-CM | POA: Diagnosis not present

## 2022-07-07 DIAGNOSIS — M6281 Muscle weakness (generalized): Secondary | ICD-10-CM | POA: Diagnosis not present

## 2022-07-07 DIAGNOSIS — R531 Weakness: Secondary | ICD-10-CM | POA: Diagnosis not present

## 2022-07-27 DIAGNOSIS — J45909 Unspecified asthma, uncomplicated: Secondary | ICD-10-CM | POA: Diagnosis not present

## 2022-07-27 DIAGNOSIS — Z95828 Presence of other vascular implants and grafts: Secondary | ICD-10-CM | POA: Diagnosis not present

## 2022-07-27 DIAGNOSIS — K529 Noninfective gastroenteritis and colitis, unspecified: Secondary | ICD-10-CM | POA: Diagnosis not present

## 2022-07-27 DIAGNOSIS — Z792 Long term (current) use of antibiotics: Secondary | ICD-10-CM | POA: Diagnosis not present

## 2022-07-27 DIAGNOSIS — M151 Heberden's nodes (with arthropathy): Secondary | ICD-10-CM | POA: Diagnosis not present

## 2022-07-27 DIAGNOSIS — Z9181 History of falling: Secondary | ICD-10-CM | POA: Diagnosis not present

## 2022-07-27 DIAGNOSIS — D649 Anemia, unspecified: Secondary | ICD-10-CM | POA: Diagnosis not present

## 2022-07-27 DIAGNOSIS — E7800 Pure hypercholesterolemia, unspecified: Secondary | ICD-10-CM | POA: Diagnosis not present

## 2022-07-27 DIAGNOSIS — F32A Depression, unspecified: Secondary | ICD-10-CM | POA: Diagnosis not present

## 2022-07-27 DIAGNOSIS — Z79899 Other long term (current) drug therapy: Secondary | ICD-10-CM | POA: Diagnosis not present

## 2022-07-27 DIAGNOSIS — F03A4 Unspecified dementia, mild, with anxiety: Secondary | ICD-10-CM | POA: Diagnosis not present

## 2022-07-27 DIAGNOSIS — F03A3 Unspecified dementia, mild, with mood disturbance: Secondary | ICD-10-CM | POA: Diagnosis not present

## 2022-07-27 DIAGNOSIS — Z7951 Long term (current) use of inhaled steroids: Secondary | ICD-10-CM | POA: Diagnosis not present

## 2022-07-27 DIAGNOSIS — G4700 Insomnia, unspecified: Secondary | ICD-10-CM | POA: Diagnosis not present

## 2022-07-27 DIAGNOSIS — Z9981 Dependence on supplemental oxygen: Secondary | ICD-10-CM | POA: Diagnosis not present

## 2022-07-27 DIAGNOSIS — I700 Atherosclerosis of aorta: Secondary | ICD-10-CM | POA: Diagnosis not present

## 2022-07-27 DIAGNOSIS — Z791 Long term (current) use of non-steroidal anti-inflammatories (NSAID): Secondary | ICD-10-CM | POA: Diagnosis not present

## 2022-07-27 DIAGNOSIS — I10 Essential (primary) hypertension: Secondary | ICD-10-CM | POA: Diagnosis not present

## 2022-07-27 DIAGNOSIS — K219 Gastro-esophageal reflux disease without esophagitis: Secondary | ICD-10-CM | POA: Diagnosis not present

## 2022-07-28 DIAGNOSIS — R440 Auditory hallucinations: Secondary | ICD-10-CM | POA: Diagnosis not present

## 2022-07-28 DIAGNOSIS — F411 Generalized anxiety disorder: Secondary | ICD-10-CM | POA: Diagnosis not present

## 2022-07-28 DIAGNOSIS — F5101 Primary insomnia: Secondary | ICD-10-CM | POA: Diagnosis not present

## 2022-07-28 DIAGNOSIS — F331 Major depressive disorder, recurrent, moderate: Secondary | ICD-10-CM | POA: Diagnosis not present

## 2022-08-09 DIAGNOSIS — J301 Allergic rhinitis due to pollen: Secondary | ICD-10-CM | POA: Diagnosis not present

## 2022-08-09 DIAGNOSIS — R053 Chronic cough: Secondary | ICD-10-CM | POA: Diagnosis not present

## 2022-08-09 DIAGNOSIS — J4550 Severe persistent asthma, uncomplicated: Secondary | ICD-10-CM | POA: Diagnosis not present

## 2022-08-11 DIAGNOSIS — F0390 Unspecified dementia without behavioral disturbance: Secondary | ICD-10-CM | POA: Diagnosis not present

## 2022-08-11 DIAGNOSIS — L539 Erythematous condition, unspecified: Secondary | ICD-10-CM | POA: Diagnosis not present

## 2022-08-11 DIAGNOSIS — L304 Erythema intertrigo: Secondary | ICD-10-CM | POA: Diagnosis not present

## 2022-08-23 DIAGNOSIS — G253 Myoclonus: Secondary | ICD-10-CM | POA: Diagnosis not present

## 2022-08-23 DIAGNOSIS — R29898 Other symptoms and signs involving the musculoskeletal system: Secondary | ICD-10-CM | POA: Diagnosis not present

## 2022-08-23 DIAGNOSIS — R200 Anesthesia of skin: Secondary | ICD-10-CM | POA: Diagnosis not present

## 2022-08-24 DIAGNOSIS — Z9981 Dependence on supplemental oxygen: Secondary | ICD-10-CM | POA: Diagnosis not present

## 2022-08-24 DIAGNOSIS — F03A3 Unspecified dementia, mild, with mood disturbance: Secondary | ICD-10-CM | POA: Diagnosis not present

## 2022-08-24 DIAGNOSIS — Z7951 Long term (current) use of inhaled steroids: Secondary | ICD-10-CM | POA: Diagnosis not present

## 2022-08-24 DIAGNOSIS — K219 Gastro-esophageal reflux disease without esophagitis: Secondary | ICD-10-CM | POA: Diagnosis not present

## 2022-08-24 DIAGNOSIS — Z9181 History of falling: Secondary | ICD-10-CM | POA: Diagnosis not present

## 2022-08-24 DIAGNOSIS — J45909 Unspecified asthma, uncomplicated: Secondary | ICD-10-CM | POA: Diagnosis not present

## 2022-08-24 DIAGNOSIS — E7800 Pure hypercholesterolemia, unspecified: Secondary | ICD-10-CM | POA: Diagnosis not present

## 2022-08-24 DIAGNOSIS — F32A Depression, unspecified: Secondary | ICD-10-CM | POA: Diagnosis not present

## 2022-08-24 DIAGNOSIS — F03A4 Unspecified dementia, mild, with anxiety: Secondary | ICD-10-CM | POA: Diagnosis not present

## 2022-08-24 DIAGNOSIS — Z79899 Other long term (current) drug therapy: Secondary | ICD-10-CM | POA: Diagnosis not present

## 2022-08-24 DIAGNOSIS — I700 Atherosclerosis of aorta: Secondary | ICD-10-CM | POA: Diagnosis not present

## 2022-08-24 DIAGNOSIS — D649 Anemia, unspecified: Secondary | ICD-10-CM | POA: Diagnosis not present

## 2022-08-24 DIAGNOSIS — Z95828 Presence of other vascular implants and grafts: Secondary | ICD-10-CM | POA: Diagnosis not present

## 2022-08-24 DIAGNOSIS — I10 Essential (primary) hypertension: Secondary | ICD-10-CM | POA: Diagnosis not present

## 2022-08-24 DIAGNOSIS — Z791 Long term (current) use of non-steroidal anti-inflammatories (NSAID): Secondary | ICD-10-CM | POA: Diagnosis not present

## 2022-08-24 DIAGNOSIS — M151 Heberden's nodes (with arthropathy): Secondary | ICD-10-CM | POA: Diagnosis not present

## 2022-08-24 DIAGNOSIS — G4700 Insomnia, unspecified: Secondary | ICD-10-CM | POA: Diagnosis not present

## 2022-08-24 DIAGNOSIS — Z792 Long term (current) use of antibiotics: Secondary | ICD-10-CM | POA: Diagnosis not present

## 2022-08-24 DIAGNOSIS — K529 Noninfective gastroenteritis and colitis, unspecified: Secondary | ICD-10-CM | POA: Diagnosis not present

## 2022-08-25 DIAGNOSIS — F331 Major depressive disorder, recurrent, moderate: Secondary | ICD-10-CM | POA: Diagnosis not present

## 2022-08-25 DIAGNOSIS — F411 Generalized anxiety disorder: Secondary | ICD-10-CM | POA: Diagnosis not present

## 2022-08-25 DIAGNOSIS — R440 Auditory hallucinations: Secondary | ICD-10-CM | POA: Diagnosis not present

## 2022-08-25 DIAGNOSIS — F5101 Primary insomnia: Secondary | ICD-10-CM | POA: Diagnosis not present

## 2022-08-29 DIAGNOSIS — E7800 Pure hypercholesterolemia, unspecified: Secondary | ICD-10-CM | POA: Diagnosis not present

## 2022-08-29 DIAGNOSIS — A080 Rotaviral enteritis: Secondary | ICD-10-CM | POA: Diagnosis not present

## 2022-08-29 DIAGNOSIS — K219 Gastro-esophageal reflux disease without esophagitis: Secondary | ICD-10-CM | POA: Diagnosis not present

## 2022-08-29 DIAGNOSIS — R110 Nausea: Secondary | ICD-10-CM | POA: Diagnosis not present

## 2022-08-29 DIAGNOSIS — R000 Tachycardia, unspecified: Secondary | ICD-10-CM | POA: Diagnosis not present

## 2022-08-29 DIAGNOSIS — R112 Nausea with vomiting, unspecified: Secondary | ICD-10-CM | POA: Diagnosis not present

## 2022-08-29 DIAGNOSIS — D649 Anemia, unspecified: Secondary | ICD-10-CM | POA: Diagnosis not present

## 2022-08-29 DIAGNOSIS — I10 Essential (primary) hypertension: Secondary | ICD-10-CM | POA: Diagnosis not present

## 2022-08-29 DIAGNOSIS — Z86711 Personal history of pulmonary embolism: Secondary | ICD-10-CM | POA: Diagnosis not present

## 2022-08-29 DIAGNOSIS — D72829 Elevated white blood cell count, unspecified: Secondary | ICD-10-CM | POA: Diagnosis not present

## 2022-08-29 DIAGNOSIS — Z7401 Bed confinement status: Secondary | ICD-10-CM | POA: Diagnosis not present

## 2022-08-29 DIAGNOSIS — J45909 Unspecified asthma, uncomplicated: Secondary | ICD-10-CM | POA: Diagnosis not present

## 2022-08-29 DIAGNOSIS — Z882 Allergy status to sulfonamides status: Secondary | ICD-10-CM | POA: Diagnosis not present

## 2022-08-29 DIAGNOSIS — R0902 Hypoxemia: Secondary | ICD-10-CM | POA: Diagnosis not present

## 2022-08-29 DIAGNOSIS — Z1152 Encounter for screening for COVID-19: Secondary | ICD-10-CM | POA: Diagnosis not present

## 2022-08-29 DIAGNOSIS — N170 Acute kidney failure with tubular necrosis: Secondary | ICD-10-CM | POA: Diagnosis not present

## 2022-08-29 DIAGNOSIS — N390 Urinary tract infection, site not specified: Secondary | ICD-10-CM | POA: Diagnosis not present

## 2022-08-29 DIAGNOSIS — N281 Cyst of kidney, acquired: Secondary | ICD-10-CM | POA: Diagnosis not present

## 2022-08-29 DIAGNOSIS — R279 Unspecified lack of coordination: Secondary | ICD-10-CM | POA: Diagnosis not present

## 2022-08-29 DIAGNOSIS — Z79899 Other long term (current) drug therapy: Secondary | ICD-10-CM | POA: Diagnosis not present

## 2022-08-29 DIAGNOSIS — E860 Dehydration: Secondary | ICD-10-CM | POA: Diagnosis not present

## 2022-08-29 DIAGNOSIS — G629 Polyneuropathy, unspecified: Secondary | ICD-10-CM | POA: Diagnosis not present

## 2022-08-29 DIAGNOSIS — M1990 Unspecified osteoarthritis, unspecified site: Secondary | ICD-10-CM | POA: Diagnosis not present

## 2022-08-29 DIAGNOSIS — B9620 Unspecified Escherichia coli [E. coli] as the cause of diseases classified elsewhere: Secondary | ICD-10-CM | POA: Diagnosis not present

## 2022-08-29 DIAGNOSIS — F419 Anxiety disorder, unspecified: Secondary | ICD-10-CM | POA: Diagnosis not present

## 2022-08-29 DIAGNOSIS — F32A Depression, unspecified: Secondary | ICD-10-CM | POA: Diagnosis not present

## 2022-08-29 DIAGNOSIS — R1111 Vomiting without nausea: Secondary | ICD-10-CM | POA: Diagnosis not present

## 2022-09-01 DIAGNOSIS — E860 Dehydration: Secondary | ICD-10-CM | POA: Diagnosis not present

## 2022-09-01 DIAGNOSIS — N390 Urinary tract infection, site not specified: Secondary | ICD-10-CM | POA: Diagnosis not present

## 2022-09-01 DIAGNOSIS — F419 Anxiety disorder, unspecified: Secondary | ICD-10-CM | POA: Diagnosis not present

## 2022-09-01 DIAGNOSIS — N170 Acute kidney failure with tubular necrosis: Secondary | ICD-10-CM | POA: Diagnosis not present

## 2022-09-01 DIAGNOSIS — K219 Gastro-esophageal reflux disease without esophagitis: Secondary | ICD-10-CM | POA: Diagnosis not present

## 2022-09-01 DIAGNOSIS — D649 Anemia, unspecified: Secondary | ICD-10-CM | POA: Diagnosis not present

## 2022-09-01 DIAGNOSIS — F0390 Unspecified dementia without behavioral disturbance: Secondary | ICD-10-CM | POA: Diagnosis not present

## 2022-09-01 DIAGNOSIS — A080 Rotaviral enteritis: Secondary | ICD-10-CM | POA: Diagnosis not present

## 2022-09-01 DIAGNOSIS — M6281 Muscle weakness (generalized): Secondary | ICD-10-CM | POA: Diagnosis not present

## 2022-09-01 DIAGNOSIS — R269 Unspecified abnormalities of gait and mobility: Secondary | ICD-10-CM | POA: Diagnosis not present

## 2022-09-02 DIAGNOSIS — B9620 Unspecified Escherichia coli [E. coli] as the cause of diseases classified elsewhere: Secondary | ICD-10-CM | POA: Diagnosis not present

## 2022-09-02 DIAGNOSIS — Z792 Long term (current) use of antibiotics: Secondary | ICD-10-CM | POA: Diagnosis not present

## 2022-09-02 DIAGNOSIS — N390 Urinary tract infection, site not specified: Secondary | ICD-10-CM | POA: Diagnosis not present

## 2022-09-02 DIAGNOSIS — D649 Anemia, unspecified: Secondary | ICD-10-CM | POA: Diagnosis not present

## 2022-09-02 DIAGNOSIS — Z9181 History of falling: Secondary | ICD-10-CM | POA: Diagnosis not present

## 2022-09-02 DIAGNOSIS — K529 Noninfective gastroenteritis and colitis, unspecified: Secondary | ICD-10-CM | POA: Diagnosis not present

## 2022-09-02 DIAGNOSIS — N170 Acute kidney failure with tubular necrosis: Secondary | ICD-10-CM | POA: Diagnosis not present

## 2022-09-02 DIAGNOSIS — K219 Gastro-esophageal reflux disease without esophagitis: Secondary | ICD-10-CM | POA: Diagnosis not present

## 2022-09-02 DIAGNOSIS — I700 Atherosclerosis of aorta: Secondary | ICD-10-CM | POA: Diagnosis not present

## 2022-09-02 DIAGNOSIS — Z86711 Personal history of pulmonary embolism: Secondary | ICD-10-CM | POA: Diagnosis not present

## 2022-09-02 DIAGNOSIS — E7800 Pure hypercholesterolemia, unspecified: Secondary | ICD-10-CM | POA: Diagnosis not present

## 2022-09-02 DIAGNOSIS — Z9981 Dependence on supplemental oxygen: Secondary | ICD-10-CM | POA: Diagnosis not present

## 2022-09-02 DIAGNOSIS — F418 Other specified anxiety disorders: Secondary | ICD-10-CM | POA: Diagnosis not present

## 2022-09-02 DIAGNOSIS — I10 Essential (primary) hypertension: Secondary | ICD-10-CM | POA: Diagnosis not present

## 2022-09-02 DIAGNOSIS — A080 Rotaviral enteritis: Secondary | ICD-10-CM | POA: Diagnosis not present

## 2022-09-02 DIAGNOSIS — J4489 Other specified chronic obstructive pulmonary disease: Secondary | ICD-10-CM | POA: Diagnosis not present

## 2022-09-02 DIAGNOSIS — Z79899 Other long term (current) drug therapy: Secondary | ICD-10-CM | POA: Diagnosis not present

## 2022-09-02 DIAGNOSIS — Z7951 Long term (current) use of inhaled steroids: Secondary | ICD-10-CM | POA: Diagnosis not present

## 2022-09-02 DIAGNOSIS — G629 Polyneuropathy, unspecified: Secondary | ICD-10-CM | POA: Diagnosis not present

## 2022-09-13 DIAGNOSIS — B351 Tinea unguium: Secondary | ICD-10-CM | POA: Diagnosis not present

## 2022-09-13 DIAGNOSIS — I7091 Generalized atherosclerosis: Secondary | ICD-10-CM | POA: Diagnosis not present

## 2022-09-21 DIAGNOSIS — Z66 Do not resuscitate: Secondary | ICD-10-CM | POA: Diagnosis not present

## 2022-09-21 DIAGNOSIS — Z7401 Bed confinement status: Secondary | ICD-10-CM | POA: Diagnosis not present

## 2022-09-21 DIAGNOSIS — J1569 Pneumonia due to other gram-negative bacteria: Secondary | ICD-10-CM | POA: Diagnosis not present

## 2022-09-21 DIAGNOSIS — R059 Cough, unspecified: Secondary | ICD-10-CM | POA: Diagnosis not present

## 2022-09-21 DIAGNOSIS — R062 Wheezing: Secondary | ICD-10-CM | POA: Diagnosis not present

## 2022-09-21 DIAGNOSIS — Z882 Allergy status to sulfonamides status: Secondary | ICD-10-CM | POA: Diagnosis not present

## 2022-09-21 DIAGNOSIS — E7800 Pure hypercholesterolemia, unspecified: Secondary | ICD-10-CM | POA: Diagnosis not present

## 2022-09-21 DIAGNOSIS — F0390 Unspecified dementia without behavioral disturbance: Secondary | ICD-10-CM | POA: Diagnosis not present

## 2022-09-21 DIAGNOSIS — F419 Anxiety disorder, unspecified: Secondary | ICD-10-CM | POA: Diagnosis not present

## 2022-09-21 DIAGNOSIS — Z9981 Dependence on supplemental oxygen: Secondary | ICD-10-CM | POA: Diagnosis not present

## 2022-09-21 DIAGNOSIS — J1100 Influenza due to unidentified influenza virus with unspecified type of pneumonia: Secondary | ICD-10-CM | POA: Diagnosis not present

## 2022-09-21 DIAGNOSIS — R0902 Hypoxemia: Secondary | ICD-10-CM | POA: Diagnosis not present

## 2022-09-21 DIAGNOSIS — R279 Unspecified lack of coordination: Secondary | ICD-10-CM | POA: Diagnosis not present

## 2022-09-21 DIAGNOSIS — R410 Disorientation, unspecified: Secondary | ICD-10-CM | POA: Diagnosis not present

## 2022-09-21 DIAGNOSIS — D649 Anemia, unspecified: Secondary | ICD-10-CM | POA: Diagnosis not present

## 2022-09-21 DIAGNOSIS — I10 Essential (primary) hypertension: Secondary | ICD-10-CM | POA: Diagnosis not present

## 2022-09-21 DIAGNOSIS — J90 Pleural effusion, not elsewhere classified: Secondary | ICD-10-CM | POA: Diagnosis not present

## 2022-09-21 DIAGNOSIS — J440 Chronic obstructive pulmonary disease with acute lower respiratory infection: Secondary | ICD-10-CM | POA: Diagnosis not present

## 2022-09-21 DIAGNOSIS — Z79899 Other long term (current) drug therapy: Secondary | ICD-10-CM | POA: Diagnosis not present

## 2022-09-21 DIAGNOSIS — J1008 Influenza due to other identified influenza virus with other specified pneumonia: Secondary | ICD-10-CM | POA: Diagnosis not present

## 2022-09-21 DIAGNOSIS — J9601 Acute respiratory failure with hypoxia: Secondary | ICD-10-CM | POA: Diagnosis not present

## 2022-09-21 DIAGNOSIS — J189 Pneumonia, unspecified organism: Secondary | ICD-10-CM | POA: Diagnosis not present

## 2022-09-21 DIAGNOSIS — I700 Atherosclerosis of aorta: Secondary | ICD-10-CM | POA: Diagnosis not present

## 2022-09-21 DIAGNOSIS — N179 Acute kidney failure, unspecified: Secondary | ICD-10-CM | POA: Diagnosis not present

## 2022-09-21 DIAGNOSIS — Z86711 Personal history of pulmonary embolism: Secondary | ICD-10-CM | POA: Diagnosis not present

## 2022-09-21 DIAGNOSIS — R0600 Dyspnea, unspecified: Secondary | ICD-10-CM | POA: Diagnosis not present

## 2022-09-21 DIAGNOSIS — M1990 Unspecified osteoarthritis, unspecified site: Secondary | ICD-10-CM | POA: Diagnosis not present

## 2022-09-21 DIAGNOSIS — F32A Depression, unspecified: Secondary | ICD-10-CM | POA: Diagnosis not present

## 2022-09-21 DIAGNOSIS — Z22322 Carrier or suspected carrier of Methicillin resistant Staphylococcus aureus: Secondary | ICD-10-CM | POA: Diagnosis not present

## 2022-09-21 DIAGNOSIS — J9811 Atelectasis: Secondary | ICD-10-CM | POA: Diagnosis not present

## 2022-09-21 DIAGNOSIS — E875 Hyperkalemia: Secondary | ICD-10-CM | POA: Diagnosis not present

## 2022-10-01 DIAGNOSIS — Z7951 Long term (current) use of inhaled steroids: Secondary | ICD-10-CM | POA: Diagnosis not present

## 2022-10-01 DIAGNOSIS — J440 Chronic obstructive pulmonary disease with acute lower respiratory infection: Secondary | ICD-10-CM | POA: Diagnosis not present

## 2022-10-01 DIAGNOSIS — Z9981 Dependence on supplemental oxygen: Secondary | ICD-10-CM | POA: Diagnosis not present

## 2022-10-01 DIAGNOSIS — J9811 Atelectasis: Secondary | ICD-10-CM | POA: Diagnosis not present

## 2022-10-01 DIAGNOSIS — D649 Anemia, unspecified: Secondary | ICD-10-CM | POA: Diagnosis not present

## 2022-10-01 DIAGNOSIS — Z9181 History of falling: Secondary | ICD-10-CM | POA: Diagnosis not present

## 2022-10-01 DIAGNOSIS — E559 Vitamin D deficiency, unspecified: Secondary | ICD-10-CM | POA: Diagnosis not present

## 2022-10-01 DIAGNOSIS — E538 Deficiency of other specified B group vitamins: Secondary | ICD-10-CM | POA: Diagnosis not present

## 2022-10-01 DIAGNOSIS — J15212 Pneumonia due to Methicillin resistant Staphylococcus aureus: Secondary | ICD-10-CM | POA: Diagnosis not present

## 2022-10-01 DIAGNOSIS — F0394 Unspecified dementia, unspecified severity, with anxiety: Secondary | ICD-10-CM | POA: Diagnosis not present

## 2022-10-01 DIAGNOSIS — I10 Essential (primary) hypertension: Secondary | ICD-10-CM | POA: Diagnosis not present

## 2022-10-01 DIAGNOSIS — J1008 Influenza due to other identified influenza virus with other specified pneumonia: Secondary | ICD-10-CM | POA: Diagnosis not present

## 2022-10-01 DIAGNOSIS — Z79899 Other long term (current) drug therapy: Secondary | ICD-10-CM | POA: Diagnosis not present

## 2022-10-01 DIAGNOSIS — G629 Polyneuropathy, unspecified: Secondary | ICD-10-CM | POA: Diagnosis not present

## 2022-10-01 DIAGNOSIS — Z791 Long term (current) use of non-steroidal anti-inflammatories (NSAID): Secondary | ICD-10-CM | POA: Diagnosis not present

## 2022-10-01 DIAGNOSIS — J441 Chronic obstructive pulmonary disease with (acute) exacerbation: Secondary | ICD-10-CM | POA: Diagnosis not present

## 2022-10-04 DIAGNOSIS — J1100 Influenza due to unidentified influenza virus with unspecified type of pneumonia: Secondary | ICD-10-CM | POA: Diagnosis not present

## 2022-10-06 DIAGNOSIS — R269 Unspecified abnormalities of gait and mobility: Secondary | ICD-10-CM | POA: Diagnosis not present

## 2022-10-06 DIAGNOSIS — M6281 Muscle weakness (generalized): Secondary | ICD-10-CM | POA: Diagnosis not present

## 2022-10-06 DIAGNOSIS — J1100 Influenza due to unidentified influenza virus with unspecified type of pneumonia: Secondary | ICD-10-CM | POA: Diagnosis not present

## 2022-10-06 DIAGNOSIS — D509 Iron deficiency anemia, unspecified: Secondary | ICD-10-CM | POA: Diagnosis not present

## 2022-10-06 DIAGNOSIS — E559 Vitamin D deficiency, unspecified: Secondary | ICD-10-CM | POA: Diagnosis not present

## 2022-10-06 DIAGNOSIS — N179 Acute kidney failure, unspecified: Secondary | ICD-10-CM | POA: Diagnosis not present

## 2022-10-06 DIAGNOSIS — F0390 Unspecified dementia without behavioral disturbance: Secondary | ICD-10-CM | POA: Diagnosis not present

## 2022-10-06 DIAGNOSIS — E875 Hyperkalemia: Secondary | ICD-10-CM | POA: Diagnosis not present

## 2022-10-11 DIAGNOSIS — E119 Type 2 diabetes mellitus without complications: Secondary | ICD-10-CM | POA: Diagnosis not present

## 2022-10-11 DIAGNOSIS — D518 Other vitamin B12 deficiency anemias: Secondary | ICD-10-CM | POA: Diagnosis not present

## 2022-10-11 DIAGNOSIS — E782 Mixed hyperlipidemia: Secondary | ICD-10-CM | POA: Diagnosis not present

## 2022-10-11 DIAGNOSIS — Z79899 Other long term (current) drug therapy: Secondary | ICD-10-CM | POA: Diagnosis not present

## 2022-10-12 DIAGNOSIS — F411 Generalized anxiety disorder: Secondary | ICD-10-CM | POA: Diagnosis not present

## 2022-10-12 DIAGNOSIS — F331 Major depressive disorder, recurrent, moderate: Secondary | ICD-10-CM | POA: Diagnosis not present

## 2022-10-13 DIAGNOSIS — F0390 Unspecified dementia without behavioral disturbance: Secondary | ICD-10-CM | POA: Diagnosis not present

## 2022-10-13 DIAGNOSIS — F5101 Primary insomnia: Secondary | ICD-10-CM | POA: Diagnosis not present

## 2022-10-13 DIAGNOSIS — R799 Abnormal finding of blood chemistry, unspecified: Secondary | ICD-10-CM | POA: Diagnosis not present

## 2022-10-13 DIAGNOSIS — F331 Major depressive disorder, recurrent, moderate: Secondary | ICD-10-CM | POA: Diagnosis not present

## 2022-10-13 DIAGNOSIS — D649 Anemia, unspecified: Secondary | ICD-10-CM | POA: Diagnosis not present

## 2022-10-13 DIAGNOSIS — R440 Auditory hallucinations: Secondary | ICD-10-CM | POA: Diagnosis not present

## 2022-10-13 DIAGNOSIS — F411 Generalized anxiety disorder: Secondary | ICD-10-CM | POA: Diagnosis not present

## 2022-10-13 DIAGNOSIS — E8809 Other disorders of plasma-protein metabolism, not elsewhere classified: Secondary | ICD-10-CM | POA: Diagnosis not present

## 2022-10-13 DIAGNOSIS — R770 Abnormality of albumin: Secondary | ICD-10-CM | POA: Diagnosis not present

## 2022-10-14 DIAGNOSIS — J9811 Atelectasis: Secondary | ICD-10-CM | POA: Diagnosis not present

## 2022-10-14 DIAGNOSIS — Z79899 Other long term (current) drug therapy: Secondary | ICD-10-CM | POA: Diagnosis not present

## 2022-10-14 DIAGNOSIS — G629 Polyneuropathy, unspecified: Secondary | ICD-10-CM | POA: Diagnosis not present

## 2022-10-14 DIAGNOSIS — J441 Chronic obstructive pulmonary disease with (acute) exacerbation: Secondary | ICD-10-CM | POA: Diagnosis not present

## 2022-10-14 DIAGNOSIS — D649 Anemia, unspecified: Secondary | ICD-10-CM | POA: Diagnosis not present

## 2022-10-14 DIAGNOSIS — I10 Essential (primary) hypertension: Secondary | ICD-10-CM | POA: Diagnosis not present

## 2022-10-14 DIAGNOSIS — J440 Chronic obstructive pulmonary disease with acute lower respiratory infection: Secondary | ICD-10-CM | POA: Diagnosis not present

## 2022-10-14 DIAGNOSIS — J15212 Pneumonia due to Methicillin resistant Staphylococcus aureus: Secondary | ICD-10-CM | POA: Diagnosis not present

## 2022-10-14 DIAGNOSIS — F0394 Unspecified dementia, unspecified severity, with anxiety: Secondary | ICD-10-CM | POA: Diagnosis not present

## 2022-10-14 DIAGNOSIS — J1008 Influenza due to other identified influenza virus with other specified pneumonia: Secondary | ICD-10-CM | POA: Diagnosis not present

## 2022-10-14 DIAGNOSIS — E538 Deficiency of other specified B group vitamins: Secondary | ICD-10-CM | POA: Diagnosis not present

## 2022-10-14 DIAGNOSIS — Z9181 History of falling: Secondary | ICD-10-CM | POA: Diagnosis not present

## 2022-10-14 DIAGNOSIS — Z9981 Dependence on supplemental oxygen: Secondary | ICD-10-CM | POA: Diagnosis not present

## 2022-10-14 DIAGNOSIS — E559 Vitamin D deficiency, unspecified: Secondary | ICD-10-CM | POA: Diagnosis not present

## 2022-10-14 DIAGNOSIS — Z791 Long term (current) use of non-steroidal anti-inflammatories (NSAID): Secondary | ICD-10-CM | POA: Diagnosis not present

## 2022-10-14 DIAGNOSIS — Z7951 Long term (current) use of inhaled steroids: Secondary | ICD-10-CM | POA: Diagnosis not present

## 2022-10-27 DIAGNOSIS — F0390 Unspecified dementia without behavioral disturbance: Secondary | ICD-10-CM | POA: Diagnosis not present

## 2022-10-27 DIAGNOSIS — F39 Unspecified mood [affective] disorder: Secondary | ICD-10-CM | POA: Diagnosis not present

## 2022-10-27 DIAGNOSIS — F419 Anxiety disorder, unspecified: Secondary | ICD-10-CM | POA: Diagnosis not present

## 2022-10-27 DIAGNOSIS — F330 Major depressive disorder, recurrent, mild: Secondary | ICD-10-CM | POA: Diagnosis not present

## 2022-11-01 DIAGNOSIS — F0390 Unspecified dementia without behavioral disturbance: Secondary | ICD-10-CM | POA: Diagnosis not present

## 2022-11-01 DIAGNOSIS — J1100 Influenza due to unidentified influenza virus with unspecified type of pneumonia: Secondary | ICD-10-CM | POA: Diagnosis not present

## 2022-11-01 DIAGNOSIS — K219 Gastro-esophageal reflux disease without esophagitis: Secondary | ICD-10-CM | POA: Diagnosis not present

## 2022-11-01 DIAGNOSIS — E860 Dehydration: Secondary | ICD-10-CM | POA: Diagnosis not present

## 2022-11-01 DIAGNOSIS — D509 Iron deficiency anemia, unspecified: Secondary | ICD-10-CM | POA: Diagnosis not present

## 2022-11-01 DIAGNOSIS — Z66 Do not resuscitate: Secondary | ICD-10-CM | POA: Diagnosis not present

## 2022-11-01 DIAGNOSIS — E538 Deficiency of other specified B group vitamins: Secondary | ICD-10-CM | POA: Diagnosis not present

## 2022-11-01 DIAGNOSIS — Z79899 Other long term (current) drug therapy: Secondary | ICD-10-CM | POA: Diagnosis not present

## 2022-11-01 DIAGNOSIS — N281 Cyst of kidney, acquired: Secondary | ICD-10-CM | POA: Diagnosis not present

## 2022-11-01 DIAGNOSIS — J984 Other disorders of lung: Secondary | ICD-10-CM | POA: Diagnosis not present

## 2022-11-01 DIAGNOSIS — I10 Essential (primary) hypertension: Secondary | ICD-10-CM | POA: Diagnosis not present

## 2022-11-01 DIAGNOSIS — J449 Chronic obstructive pulmonary disease, unspecified: Secondary | ICD-10-CM | POA: Diagnosis not present

## 2022-11-01 DIAGNOSIS — N189 Chronic kidney disease, unspecified: Secondary | ICD-10-CM | POA: Diagnosis not present

## 2022-11-01 DIAGNOSIS — R410 Disorientation, unspecified: Secondary | ICD-10-CM | POA: Diagnosis not present

## 2022-11-01 DIAGNOSIS — Z792 Long term (current) use of antibiotics: Secondary | ICD-10-CM | POA: Diagnosis not present

## 2022-11-01 DIAGNOSIS — G9341 Metabolic encephalopathy: Secondary | ICD-10-CM | POA: Diagnosis not present

## 2022-11-01 DIAGNOSIS — E875 Hyperkalemia: Secondary | ICD-10-CM | POA: Diagnosis not present

## 2022-11-01 DIAGNOSIS — G629 Polyneuropathy, unspecified: Secondary | ICD-10-CM | POA: Diagnosis not present

## 2022-11-01 DIAGNOSIS — M1990 Unspecified osteoarthritis, unspecified site: Secondary | ICD-10-CM | POA: Diagnosis not present

## 2022-11-01 DIAGNOSIS — E782 Mixed hyperlipidemia: Secondary | ICD-10-CM | POA: Diagnosis not present

## 2022-11-01 DIAGNOSIS — R4182 Altered mental status, unspecified: Secondary | ICD-10-CM | POA: Diagnosis not present

## 2022-11-01 DIAGNOSIS — E7800 Pure hypercholesterolemia, unspecified: Secondary | ICD-10-CM | POA: Diagnosis not present

## 2022-11-01 DIAGNOSIS — A09 Infectious gastroenteritis and colitis, unspecified: Secondary | ICD-10-CM | POA: Diagnosis not present

## 2022-11-01 DIAGNOSIS — Z882 Allergy status to sulfonamides status: Secondary | ICD-10-CM | POA: Diagnosis not present

## 2022-11-01 DIAGNOSIS — F32A Depression, unspecified: Secondary | ICD-10-CM | POA: Diagnosis not present

## 2022-11-01 DIAGNOSIS — Z86711 Personal history of pulmonary embolism: Secondary | ICD-10-CM | POA: Diagnosis not present

## 2022-11-01 DIAGNOSIS — Z7401 Bed confinement status: Secondary | ICD-10-CM | POA: Diagnosis not present

## 2022-11-01 DIAGNOSIS — I129 Hypertensive chronic kidney disease with stage 1 through stage 4 chronic kidney disease, or unspecified chronic kidney disease: Secondary | ICD-10-CM | POA: Diagnosis not present

## 2022-11-01 DIAGNOSIS — E119 Type 2 diabetes mellitus without complications: Secondary | ICD-10-CM | POA: Diagnosis not present

## 2022-11-01 DIAGNOSIS — J189 Pneumonia, unspecified organism: Secondary | ICD-10-CM | POA: Diagnosis not present

## 2022-11-01 DIAGNOSIS — N179 Acute kidney failure, unspecified: Secondary | ICD-10-CM | POA: Diagnosis not present

## 2022-11-01 DIAGNOSIS — D518 Other vitamin B12 deficiency anemias: Secondary | ICD-10-CM | POA: Diagnosis not present

## 2022-11-01 DIAGNOSIS — R9431 Abnormal electrocardiogram [ECG] [EKG]: Secondary | ICD-10-CM | POA: Diagnosis not present

## 2022-11-01 DIAGNOSIS — K3189 Other diseases of stomach and duodenum: Secondary | ICD-10-CM | POA: Diagnosis not present

## 2022-11-01 DIAGNOSIS — F419 Anxiety disorder, unspecified: Secondary | ICD-10-CM | POA: Diagnosis not present

## 2022-11-07 DIAGNOSIS — K219 Gastro-esophageal reflux disease without esophagitis: Secondary | ICD-10-CM | POA: Diagnosis not present

## 2022-11-07 DIAGNOSIS — D649 Anemia, unspecified: Secondary | ICD-10-CM | POA: Diagnosis not present

## 2022-11-07 DIAGNOSIS — F0394 Unspecified dementia, unspecified severity, with anxiety: Secondary | ICD-10-CM | POA: Diagnosis not present

## 2022-11-07 DIAGNOSIS — I10 Essential (primary) hypertension: Secondary | ICD-10-CM | POA: Diagnosis not present

## 2022-11-07 DIAGNOSIS — E860 Dehydration: Secondary | ICD-10-CM | POA: Diagnosis not present

## 2022-11-07 DIAGNOSIS — G9341 Metabolic encephalopathy: Secondary | ICD-10-CM | POA: Diagnosis not present

## 2022-11-07 DIAGNOSIS — F0393 Unspecified dementia, unspecified severity, with mood disturbance: Secondary | ICD-10-CM | POA: Diagnosis not present

## 2022-11-07 DIAGNOSIS — K529 Noninfective gastroenteritis and colitis, unspecified: Secondary | ICD-10-CM | POA: Diagnosis not present

## 2022-11-07 DIAGNOSIS — N179 Acute kidney failure, unspecified: Secondary | ICD-10-CM | POA: Diagnosis not present

## 2022-11-07 DIAGNOSIS — Z7951 Long term (current) use of inhaled steroids: Secondary | ICD-10-CM | POA: Diagnosis not present

## 2022-11-07 DIAGNOSIS — F418 Other specified anxiety disorders: Secondary | ICD-10-CM | POA: Diagnosis not present

## 2022-11-07 DIAGNOSIS — Z9981 Dependence on supplemental oxygen: Secondary | ICD-10-CM | POA: Diagnosis not present

## 2022-11-07 DIAGNOSIS — Z79899 Other long term (current) drug therapy: Secondary | ICD-10-CM | POA: Diagnosis not present

## 2022-11-07 DIAGNOSIS — G629 Polyneuropathy, unspecified: Secondary | ICD-10-CM | POA: Diagnosis not present

## 2022-11-07 DIAGNOSIS — Z86711 Personal history of pulmonary embolism: Secondary | ICD-10-CM | POA: Diagnosis not present

## 2022-11-07 DIAGNOSIS — J45909 Unspecified asthma, uncomplicated: Secondary | ICD-10-CM | POA: Diagnosis not present

## 2022-11-07 DIAGNOSIS — E785 Hyperlipidemia, unspecified: Secondary | ICD-10-CM | POA: Diagnosis not present

## 2022-11-07 DIAGNOSIS — Z9181 History of falling: Secondary | ICD-10-CM | POA: Diagnosis not present

## 2022-11-10 DIAGNOSIS — F411 Generalized anxiety disorder: Secondary | ICD-10-CM | POA: Diagnosis not present

## 2022-11-10 DIAGNOSIS — I119 Hypertensive heart disease without heart failure: Secondary | ICD-10-CM | POA: Diagnosis not present

## 2022-11-10 DIAGNOSIS — K529 Noninfective gastroenteritis and colitis, unspecified: Secondary | ICD-10-CM | POA: Diagnosis not present

## 2022-11-10 DIAGNOSIS — R269 Unspecified abnormalities of gait and mobility: Secondary | ICD-10-CM | POA: Diagnosis not present

## 2022-11-10 DIAGNOSIS — D509 Iron deficiency anemia, unspecified: Secondary | ICD-10-CM | POA: Diagnosis not present

## 2022-11-10 DIAGNOSIS — E860 Dehydration: Secondary | ICD-10-CM | POA: Diagnosis not present

## 2022-11-10 DIAGNOSIS — N179 Acute kidney failure, unspecified: Secondary | ICD-10-CM | POA: Diagnosis not present

## 2022-11-10 DIAGNOSIS — D519 Vitamin B12 deficiency anemia, unspecified: Secondary | ICD-10-CM | POA: Diagnosis not present

## 2022-11-10 DIAGNOSIS — F5101 Primary insomnia: Secondary | ICD-10-CM | POA: Diagnosis not present

## 2022-11-10 DIAGNOSIS — R440 Auditory hallucinations: Secondary | ICD-10-CM | POA: Diagnosis not present

## 2022-11-10 DIAGNOSIS — F331 Major depressive disorder, recurrent, moderate: Secondary | ICD-10-CM | POA: Diagnosis not present

## 2022-11-10 DIAGNOSIS — F0390 Unspecified dementia without behavioral disturbance: Secondary | ICD-10-CM | POA: Diagnosis not present

## 2022-11-10 DIAGNOSIS — M6281 Muscle weakness (generalized): Secondary | ICD-10-CM | POA: Diagnosis not present

## 2022-11-14 DIAGNOSIS — J45909 Unspecified asthma, uncomplicated: Secondary | ICD-10-CM | POA: Diagnosis not present

## 2022-11-14 DIAGNOSIS — Z86711 Personal history of pulmonary embolism: Secondary | ICD-10-CM | POA: Diagnosis not present

## 2022-11-14 DIAGNOSIS — G9341 Metabolic encephalopathy: Secondary | ICD-10-CM | POA: Diagnosis not present

## 2022-11-14 DIAGNOSIS — F0394 Unspecified dementia, unspecified severity, with anxiety: Secondary | ICD-10-CM | POA: Diagnosis not present

## 2022-11-14 DIAGNOSIS — I10 Essential (primary) hypertension: Secondary | ICD-10-CM | POA: Diagnosis not present

## 2022-11-14 DIAGNOSIS — Z79899 Other long term (current) drug therapy: Secondary | ICD-10-CM | POA: Diagnosis not present

## 2022-11-14 DIAGNOSIS — F0393 Unspecified dementia, unspecified severity, with mood disturbance: Secondary | ICD-10-CM | POA: Diagnosis not present

## 2022-11-14 DIAGNOSIS — K219 Gastro-esophageal reflux disease without esophagitis: Secondary | ICD-10-CM | POA: Diagnosis not present

## 2022-11-14 DIAGNOSIS — Z9981 Dependence on supplemental oxygen: Secondary | ICD-10-CM | POA: Diagnosis not present

## 2022-11-14 DIAGNOSIS — Z7951 Long term (current) use of inhaled steroids: Secondary | ICD-10-CM | POA: Diagnosis not present

## 2022-11-14 DIAGNOSIS — D649 Anemia, unspecified: Secondary | ICD-10-CM | POA: Diagnosis not present

## 2022-11-14 DIAGNOSIS — E860 Dehydration: Secondary | ICD-10-CM | POA: Diagnosis not present

## 2022-11-14 DIAGNOSIS — G629 Polyneuropathy, unspecified: Secondary | ICD-10-CM | POA: Diagnosis not present

## 2022-11-14 DIAGNOSIS — E785 Hyperlipidemia, unspecified: Secondary | ICD-10-CM | POA: Diagnosis not present

## 2022-11-14 DIAGNOSIS — F418 Other specified anxiety disorders: Secondary | ICD-10-CM | POA: Diagnosis not present

## 2022-11-14 DIAGNOSIS — K529 Noninfective gastroenteritis and colitis, unspecified: Secondary | ICD-10-CM | POA: Diagnosis not present

## 2022-11-14 DIAGNOSIS — Z9181 History of falling: Secondary | ICD-10-CM | POA: Diagnosis not present

## 2022-11-14 DIAGNOSIS — N179 Acute kidney failure, unspecified: Secondary | ICD-10-CM | POA: Diagnosis not present

## 2022-11-15 DIAGNOSIS — J301 Allergic rhinitis due to pollen: Secondary | ICD-10-CM | POA: Diagnosis not present

## 2022-11-15 DIAGNOSIS — J4550 Severe persistent asthma, uncomplicated: Secondary | ICD-10-CM | POA: Diagnosis not present

## 2022-11-16 DIAGNOSIS — B351 Tinea unguium: Secondary | ICD-10-CM | POA: Diagnosis not present

## 2022-11-16 DIAGNOSIS — I7091 Generalized atherosclerosis: Secondary | ICD-10-CM | POA: Diagnosis not present

## 2022-11-17 DIAGNOSIS — F0390 Unspecified dementia without behavioral disturbance: Secondary | ICD-10-CM | POA: Diagnosis not present

## 2022-11-17 DIAGNOSIS — J45909 Unspecified asthma, uncomplicated: Secondary | ICD-10-CM | POA: Diagnosis not present

## 2022-11-17 DIAGNOSIS — R0989 Other specified symptoms and signs involving the circulatory and respiratory systems: Secondary | ICD-10-CM | POA: Diagnosis not present

## 2022-11-17 DIAGNOSIS — R059 Cough, unspecified: Secondary | ICD-10-CM | POA: Diagnosis not present

## 2022-11-18 DIAGNOSIS — J90 Pleural effusion, not elsewhere classified: Secondary | ICD-10-CM | POA: Diagnosis not present

## 2022-11-18 DIAGNOSIS — R918 Other nonspecific abnormal finding of lung field: Secondary | ICD-10-CM | POA: Diagnosis not present

## 2022-11-18 DIAGNOSIS — J9809 Other diseases of bronchus, not elsewhere classified: Secondary | ICD-10-CM | POA: Diagnosis not present

## 2022-11-20 DIAGNOSIS — R0602 Shortness of breath: Secondary | ICD-10-CM | POA: Diagnosis not present

## 2022-11-20 DIAGNOSIS — M1990 Unspecified osteoarthritis, unspecified site: Secondary | ICD-10-CM | POA: Diagnosis not present

## 2022-11-20 DIAGNOSIS — I10 Essential (primary) hypertension: Secondary | ICD-10-CM | POA: Diagnosis not present

## 2022-11-20 DIAGNOSIS — R059 Cough, unspecified: Secondary | ICD-10-CM | POA: Diagnosis not present

## 2022-11-20 DIAGNOSIS — Z79899 Other long term (current) drug therapy: Secondary | ICD-10-CM | POA: Diagnosis not present

## 2022-11-20 DIAGNOSIS — G629 Polyneuropathy, unspecified: Secondary | ICD-10-CM | POA: Diagnosis not present

## 2022-11-20 DIAGNOSIS — Z882 Allergy status to sulfonamides status: Secondary | ICD-10-CM | POA: Diagnosis not present

## 2022-11-20 DIAGNOSIS — F419 Anxiety disorder, unspecified: Secondary | ICD-10-CM | POA: Diagnosis not present

## 2022-11-20 DIAGNOSIS — R279 Unspecified lack of coordination: Secondary | ICD-10-CM | POA: Diagnosis not present

## 2022-11-20 DIAGNOSIS — R9431 Abnormal electrocardiogram [ECG] [EKG]: Secondary | ICD-10-CM | POA: Diagnosis not present

## 2022-11-20 DIAGNOSIS — F32A Depression, unspecified: Secondary | ICD-10-CM | POA: Diagnosis not present

## 2022-11-20 DIAGNOSIS — I509 Heart failure, unspecified: Secondary | ICD-10-CM | POA: Diagnosis not present

## 2022-11-20 DIAGNOSIS — J189 Pneumonia, unspecified organism: Secondary | ICD-10-CM | POA: Diagnosis not present

## 2022-11-20 DIAGNOSIS — R278 Other lack of coordination: Secondary | ICD-10-CM | POA: Diagnosis not present

## 2022-11-20 DIAGNOSIS — J449 Chronic obstructive pulmonary disease, unspecified: Secondary | ICD-10-CM | POA: Diagnosis not present

## 2022-11-20 DIAGNOSIS — R2681 Unsteadiness on feet: Secondary | ICD-10-CM | POA: Diagnosis not present

## 2022-11-20 DIAGNOSIS — R918 Other nonspecific abnormal finding of lung field: Secondary | ICD-10-CM | POA: Diagnosis not present

## 2022-11-20 DIAGNOSIS — I371 Nonrheumatic pulmonary valve insufficiency: Secondary | ICD-10-CM | POA: Diagnosis not present

## 2022-11-20 DIAGNOSIS — Z452 Encounter for adjustment and management of vascular access device: Secondary | ICD-10-CM | POA: Diagnosis not present

## 2022-11-20 DIAGNOSIS — K219 Gastro-esophageal reflux disease without esophagitis: Secondary | ICD-10-CM | POA: Diagnosis not present

## 2022-11-20 DIAGNOSIS — Z86711 Personal history of pulmonary embolism: Secondary | ICD-10-CM | POA: Diagnosis not present

## 2022-11-20 DIAGNOSIS — D649 Anemia, unspecified: Secondary | ICD-10-CM | POA: Diagnosis not present

## 2022-11-20 DIAGNOSIS — E876 Hypokalemia: Secondary | ICD-10-CM | POA: Diagnosis not present

## 2022-11-20 DIAGNOSIS — J9690 Respiratory failure, unspecified, unspecified whether with hypoxia or hypercapnia: Secondary | ICD-10-CM | POA: Diagnosis not present

## 2022-11-20 DIAGNOSIS — J9621 Acute and chronic respiratory failure with hypoxia: Secondary | ICD-10-CM | POA: Diagnosis not present

## 2022-11-20 DIAGNOSIS — A4102 Sepsis due to Methicillin resistant Staphylococcus aureus: Secondary | ICD-10-CM | POA: Diagnosis not present

## 2022-11-20 DIAGNOSIS — R5383 Other fatigue: Secondary | ICD-10-CM | POA: Diagnosis not present

## 2022-11-20 DIAGNOSIS — R0902 Hypoxemia: Secondary | ICD-10-CM | POA: Diagnosis not present

## 2022-11-20 DIAGNOSIS — M6281 Muscle weakness (generalized): Secondary | ICD-10-CM | POA: Diagnosis not present

## 2022-11-20 DIAGNOSIS — N179 Acute kidney failure, unspecified: Secondary | ICD-10-CM | POA: Diagnosis not present

## 2022-11-20 DIAGNOSIS — N1831 Chronic kidney disease, stage 3a: Secondary | ICD-10-CM | POA: Diagnosis not present

## 2022-11-20 DIAGNOSIS — Z9181 History of falling: Secondary | ICD-10-CM | POA: Diagnosis not present

## 2022-11-20 DIAGNOSIS — J90 Pleural effusion, not elsewhere classified: Secondary | ICD-10-CM | POA: Diagnosis not present

## 2022-11-20 DIAGNOSIS — M79604 Pain in right leg: Secondary | ICD-10-CM | POA: Diagnosis not present

## 2022-11-20 DIAGNOSIS — J45909 Unspecified asthma, uncomplicated: Secondary | ICD-10-CM | POA: Diagnosis not present

## 2022-11-20 DIAGNOSIS — Z7401 Bed confinement status: Secondary | ICD-10-CM | POA: Diagnosis not present

## 2022-11-20 DIAGNOSIS — I700 Atherosclerosis of aorta: Secondary | ICD-10-CM | POA: Diagnosis not present

## 2022-11-20 DIAGNOSIS — M6259 Muscle wasting and atrophy, not elsewhere classified, multiple sites: Secondary | ICD-10-CM | POA: Diagnosis not present

## 2022-11-20 DIAGNOSIS — R2689 Other abnormalities of gait and mobility: Secondary | ICD-10-CM | POA: Diagnosis not present

## 2022-11-20 DIAGNOSIS — F03A0 Unspecified dementia, mild, without behavioral disturbance, psychotic disturbance, mood disturbance, and anxiety: Secondary | ICD-10-CM | POA: Diagnosis not present

## 2022-11-20 DIAGNOSIS — Z7952 Long term (current) use of systemic steroids: Secondary | ICD-10-CM | POA: Diagnosis not present

## 2022-11-20 DIAGNOSIS — Z792 Long term (current) use of antibiotics: Secondary | ICD-10-CM | POA: Diagnosis not present

## 2022-11-20 DIAGNOSIS — R5381 Other malaise: Secondary | ICD-10-CM | POA: Diagnosis not present

## 2022-11-20 DIAGNOSIS — I130 Hypertensive heart and chronic kidney disease with heart failure and stage 1 through stage 4 chronic kidney disease, or unspecified chronic kidney disease: Secondary | ICD-10-CM | POA: Diagnosis not present

## 2022-11-20 DIAGNOSIS — Z7901 Long term (current) use of anticoagulants: Secondary | ICD-10-CM | POA: Diagnosis not present

## 2022-11-20 DIAGNOSIS — Z9981 Dependence on supplemental oxygen: Secondary | ICD-10-CM | POA: Diagnosis not present

## 2022-11-20 DIAGNOSIS — J4551 Severe persistent asthma with (acute) exacerbation: Secondary | ICD-10-CM | POA: Diagnosis not present

## 2022-11-20 DIAGNOSIS — E7800 Pure hypercholesterolemia, unspecified: Secondary | ICD-10-CM | POA: Diagnosis not present

## 2022-11-23 DIAGNOSIS — I371 Nonrheumatic pulmonary valve insufficiency: Secondary | ICD-10-CM | POA: Diagnosis not present

## 2022-11-29 DIAGNOSIS — E876 Hypokalemia: Secondary | ICD-10-CM | POA: Diagnosis not present

## 2022-11-29 DIAGNOSIS — A4102 Sepsis due to Methicillin resistant Staphylococcus aureus: Secondary | ICD-10-CM | POA: Diagnosis not present

## 2022-11-29 DIAGNOSIS — J189 Pneumonia, unspecified organism: Secondary | ICD-10-CM | POA: Diagnosis not present

## 2022-11-29 DIAGNOSIS — D649 Anemia, unspecified: Secondary | ICD-10-CM | POA: Diagnosis not present

## 2022-11-30 DIAGNOSIS — D649 Anemia, unspecified: Secondary | ICD-10-CM | POA: Diagnosis not present

## 2022-11-30 DIAGNOSIS — J9621 Acute and chronic respiratory failure with hypoxia: Secondary | ICD-10-CM | POA: Diagnosis not present

## 2022-11-30 DIAGNOSIS — R279 Unspecified lack of coordination: Secondary | ICD-10-CM | POA: Diagnosis not present

## 2022-11-30 DIAGNOSIS — Z452 Encounter for adjustment and management of vascular access device: Secondary | ICD-10-CM | POA: Diagnosis not present

## 2022-11-30 DIAGNOSIS — J189 Pneumonia, unspecified organism: Secondary | ICD-10-CM | POA: Diagnosis not present

## 2022-11-30 DIAGNOSIS — Z7401 Bed confinement status: Secondary | ICD-10-CM | POA: Diagnosis not present

## 2022-11-30 DIAGNOSIS — R5383 Other fatigue: Secondary | ICD-10-CM | POA: Diagnosis not present

## 2022-11-30 DIAGNOSIS — R2681 Unsteadiness on feet: Secondary | ICD-10-CM | POA: Diagnosis not present

## 2022-11-30 DIAGNOSIS — Z86711 Personal history of pulmonary embolism: Secondary | ICD-10-CM | POA: Diagnosis not present

## 2022-11-30 DIAGNOSIS — J1100 Influenza due to unidentified influenza virus with unspecified type of pneumonia: Secondary | ICD-10-CM | POA: Diagnosis not present

## 2022-11-30 DIAGNOSIS — M6281 Muscle weakness (generalized): Secondary | ICD-10-CM | POA: Diagnosis not present

## 2022-11-30 DIAGNOSIS — E876 Hypokalemia: Secondary | ICD-10-CM | POA: Diagnosis not present

## 2022-11-30 DIAGNOSIS — F419 Anxiety disorder, unspecified: Secondary | ICD-10-CM | POA: Diagnosis not present

## 2022-11-30 DIAGNOSIS — N179 Acute kidney failure, unspecified: Secondary | ICD-10-CM | POA: Diagnosis not present

## 2022-11-30 DIAGNOSIS — Z7901 Long term (current) use of anticoagulants: Secondary | ICD-10-CM | POA: Diagnosis not present

## 2022-11-30 DIAGNOSIS — M6259 Muscle wasting and atrophy, not elsewhere classified, multiple sites: Secondary | ICD-10-CM | POA: Diagnosis not present

## 2022-11-30 DIAGNOSIS — Z9981 Dependence on supplemental oxygen: Secondary | ICD-10-CM | POA: Diagnosis not present

## 2022-11-30 DIAGNOSIS — I10 Essential (primary) hypertension: Secondary | ICD-10-CM | POA: Diagnosis not present

## 2022-11-30 DIAGNOSIS — Z9181 History of falling: Secondary | ICD-10-CM | POA: Diagnosis not present

## 2022-11-30 DIAGNOSIS — R2689 Other abnormalities of gait and mobility: Secondary | ICD-10-CM | POA: Diagnosis not present

## 2022-11-30 DIAGNOSIS — R5381 Other malaise: Secondary | ICD-10-CM | POA: Diagnosis not present

## 2022-11-30 DIAGNOSIS — J4551 Severe persistent asthma with (acute) exacerbation: Secondary | ICD-10-CM | POA: Diagnosis not present

## 2022-11-30 DIAGNOSIS — M79604 Pain in right leg: Secondary | ICD-10-CM | POA: Diagnosis not present

## 2022-11-30 DIAGNOSIS — R278 Other lack of coordination: Secondary | ICD-10-CM | POA: Diagnosis not present

## 2022-11-30 DIAGNOSIS — J449 Chronic obstructive pulmonary disease, unspecified: Secondary | ICD-10-CM | POA: Diagnosis not present

## 2022-11-30 DIAGNOSIS — A4102 Sepsis due to Methicillin resistant Staphylococcus aureus: Secondary | ICD-10-CM | POA: Diagnosis not present

## 2022-11-30 DIAGNOSIS — R6520 Severe sepsis without septic shock: Secondary | ICD-10-CM | POA: Diagnosis not present

## 2022-12-01 DIAGNOSIS — N179 Acute kidney failure, unspecified: Secondary | ICD-10-CM | POA: Diagnosis not present

## 2022-12-01 DIAGNOSIS — A4102 Sepsis due to Methicillin resistant Staphylococcus aureus: Secondary | ICD-10-CM | POA: Diagnosis not present

## 2022-12-01 DIAGNOSIS — I10 Essential (primary) hypertension: Secondary | ICD-10-CM | POA: Diagnosis not present

## 2022-12-01 DIAGNOSIS — J189 Pneumonia, unspecified organism: Secondary | ICD-10-CM | POA: Diagnosis not present

## 2022-12-01 DIAGNOSIS — R6520 Severe sepsis without septic shock: Secondary | ICD-10-CM | POA: Diagnosis not present

## 2022-12-05 DIAGNOSIS — Z9981 Dependence on supplemental oxygen: Secondary | ICD-10-CM | POA: Diagnosis not present

## 2022-12-05 DIAGNOSIS — J189 Pneumonia, unspecified organism: Secondary | ICD-10-CM | POA: Diagnosis not present

## 2022-12-07 DIAGNOSIS — R5381 Other malaise: Secondary | ICD-10-CM | POA: Diagnosis not present

## 2022-12-07 DIAGNOSIS — J189 Pneumonia, unspecified organism: Secondary | ICD-10-CM | POA: Diagnosis not present

## 2022-12-12 DIAGNOSIS — F419 Anxiety disorder, unspecified: Secondary | ICD-10-CM | POA: Diagnosis not present

## 2022-12-22 DIAGNOSIS — J189 Pneumonia, unspecified organism: Secondary | ICD-10-CM | POA: Diagnosis not present

## 2022-12-22 DIAGNOSIS — F419 Anxiety disorder, unspecified: Secondary | ICD-10-CM | POA: Diagnosis not present

## 2022-12-22 DIAGNOSIS — A4102 Sepsis due to Methicillin resistant Staphylococcus aureus: Secondary | ICD-10-CM | POA: Diagnosis not present

## 2022-12-22 DIAGNOSIS — R6520 Severe sepsis without septic shock: Secondary | ICD-10-CM | POA: Diagnosis not present

## 2022-12-22 DIAGNOSIS — Z9981 Dependence on supplemental oxygen: Secondary | ICD-10-CM | POA: Diagnosis not present

## 2022-12-22 DIAGNOSIS — I10 Essential (primary) hypertension: Secondary | ICD-10-CM | POA: Diagnosis not present

## 2022-12-22 DIAGNOSIS — R5381 Other malaise: Secondary | ICD-10-CM | POA: Diagnosis not present

## 2022-12-27 DIAGNOSIS — E538 Deficiency of other specified B group vitamins: Secondary | ICD-10-CM | POA: Diagnosis not present

## 2022-12-27 DIAGNOSIS — I272 Pulmonary hypertension, unspecified: Secondary | ICD-10-CM | POA: Diagnosis not present

## 2022-12-27 DIAGNOSIS — J449 Chronic obstructive pulmonary disease, unspecified: Secondary | ICD-10-CM | POA: Diagnosis not present

## 2022-12-27 DIAGNOSIS — J4550 Severe persistent asthma, uncomplicated: Secondary | ICD-10-CM | POA: Diagnosis not present

## 2022-12-27 DIAGNOSIS — D649 Anemia, unspecified: Secondary | ICD-10-CM | POA: Diagnosis not present

## 2022-12-27 DIAGNOSIS — E559 Vitamin D deficiency, unspecified: Secondary | ICD-10-CM | POA: Diagnosis not present

## 2022-12-27 DIAGNOSIS — J301 Allergic rhinitis due to pollen: Secondary | ICD-10-CM | POA: Diagnosis not present

## 2022-12-27 DIAGNOSIS — F329 Major depressive disorder, single episode, unspecified: Secondary | ICD-10-CM | POA: Diagnosis not present

## 2022-12-27 DIAGNOSIS — Z9181 History of falling: Secondary | ICD-10-CM | POA: Diagnosis not present

## 2022-12-27 DIAGNOSIS — F0393 Unspecified dementia, unspecified severity, with mood disturbance: Secondary | ICD-10-CM | POA: Diagnosis not present

## 2022-12-27 DIAGNOSIS — I10 Essential (primary) hypertension: Secondary | ICD-10-CM | POA: Diagnosis not present

## 2022-12-27 DIAGNOSIS — F0394 Unspecified dementia, unspecified severity, with anxiety: Secondary | ICD-10-CM | POA: Diagnosis not present

## 2022-12-27 DIAGNOSIS — Z9981 Dependence on supplemental oxygen: Secondary | ICD-10-CM | POA: Diagnosis not present

## 2022-12-27 DIAGNOSIS — K219 Gastro-esophageal reflux disease without esophagitis: Secondary | ICD-10-CM | POA: Diagnosis not present

## 2022-12-27 DIAGNOSIS — Z86711 Personal history of pulmonary embolism: Secondary | ICD-10-CM | POA: Diagnosis not present

## 2022-12-27 DIAGNOSIS — Z79899 Other long term (current) drug therapy: Secondary | ICD-10-CM | POA: Diagnosis not present

## 2022-12-27 DIAGNOSIS — I270 Primary pulmonary hypertension: Secondary | ICD-10-CM | POA: Diagnosis not present

## 2022-12-27 DIAGNOSIS — Z7951 Long term (current) use of inhaled steroids: Secondary | ICD-10-CM | POA: Diagnosis not present

## 2022-12-27 DIAGNOSIS — Z8701 Personal history of pneumonia (recurrent): Secondary | ICD-10-CM | POA: Diagnosis not present

## 2022-12-29 DIAGNOSIS — F0390 Unspecified dementia without behavioral disturbance: Secondary | ICD-10-CM | POA: Diagnosis not present

## 2022-12-29 DIAGNOSIS — J45909 Unspecified asthma, uncomplicated: Secondary | ICD-10-CM | POA: Diagnosis not present

## 2022-12-29 DIAGNOSIS — J449 Chronic obstructive pulmonary disease, unspecified: Secondary | ICD-10-CM | POA: Diagnosis not present

## 2022-12-29 DIAGNOSIS — R2681 Unsteadiness on feet: Secondary | ICD-10-CM | POA: Diagnosis not present

## 2022-12-29 DIAGNOSIS — R279 Unspecified lack of coordination: Secondary | ICD-10-CM | POA: Diagnosis not present

## 2022-12-29 DIAGNOSIS — R2689 Other abnormalities of gait and mobility: Secondary | ICD-10-CM | POA: Diagnosis not present

## 2022-12-29 DIAGNOSIS — I119 Hypertensive heart disease without heart failure: Secondary | ICD-10-CM | POA: Diagnosis not present

## 2022-12-29 DIAGNOSIS — D509 Iron deficiency anemia, unspecified: Secondary | ICD-10-CM | POA: Diagnosis not present

## 2022-12-29 DIAGNOSIS — J189 Pneumonia, unspecified organism: Secondary | ICD-10-CM | POA: Diagnosis not present

## 2022-12-29 DIAGNOSIS — K219 Gastro-esophageal reflux disease without esophagitis: Secondary | ICD-10-CM | POA: Diagnosis not present

## 2023-01-02 ENCOUNTER — Telehealth (HOSPITAL_COMMUNITY): Payer: Self-pay | Admitting: Internal Medicine

## 2023-01-02 NOTE — Telephone Encounter (Signed)
Referral received and awaiting review by nurse  Attempted to contact patient with update  [phone] not in service

## 2023-01-02 NOTE — Telephone Encounter (Signed)
Pt called to f/u w/referral 

## 2023-01-04 DIAGNOSIS — J449 Chronic obstructive pulmonary disease, unspecified: Secondary | ICD-10-CM | POA: Diagnosis not present

## 2023-01-05 DIAGNOSIS — F411 Generalized anxiety disorder: Secondary | ICD-10-CM | POA: Diagnosis not present

## 2023-01-05 DIAGNOSIS — F5101 Primary insomnia: Secondary | ICD-10-CM | POA: Diagnosis not present

## 2023-01-05 DIAGNOSIS — F331 Major depressive disorder, recurrent, moderate: Secondary | ICD-10-CM | POA: Diagnosis not present

## 2023-01-05 DIAGNOSIS — R440 Auditory hallucinations: Secondary | ICD-10-CM | POA: Diagnosis not present

## 2023-01-10 DIAGNOSIS — D518 Other vitamin B12 deficiency anemias: Secondary | ICD-10-CM | POA: Diagnosis not present

## 2023-01-10 DIAGNOSIS — E119 Type 2 diabetes mellitus without complications: Secondary | ICD-10-CM | POA: Diagnosis not present

## 2023-01-10 DIAGNOSIS — Z79899 Other long term (current) drug therapy: Secondary | ICD-10-CM | POA: Diagnosis not present

## 2023-01-10 DIAGNOSIS — E782 Mixed hyperlipidemia: Secondary | ICD-10-CM | POA: Diagnosis not present

## 2023-01-12 DIAGNOSIS — D509 Iron deficiency anemia, unspecified: Secondary | ICD-10-CM | POA: Diagnosis not present

## 2023-01-12 DIAGNOSIS — N1832 Chronic kidney disease, stage 3b: Secondary | ICD-10-CM | POA: Diagnosis not present

## 2023-01-12 DIAGNOSIS — R799 Abnormal finding of blood chemistry, unspecified: Secondary | ICD-10-CM | POA: Diagnosis not present

## 2023-01-12 DIAGNOSIS — F0390 Unspecified dementia without behavioral disturbance: Secondary | ICD-10-CM | POA: Diagnosis not present

## 2023-01-18 DIAGNOSIS — F411 Generalized anxiety disorder: Secondary | ICD-10-CM | POA: Diagnosis not present

## 2023-01-18 DIAGNOSIS — F331 Major depressive disorder, recurrent, moderate: Secondary | ICD-10-CM | POA: Diagnosis not present

## 2023-01-19 DIAGNOSIS — M6281 Muscle weakness (generalized): Secondary | ICD-10-CM | POA: Diagnosis not present

## 2023-01-19 DIAGNOSIS — R0609 Other forms of dyspnea: Secondary | ICD-10-CM | POA: Diagnosis not present

## 2023-01-19 DIAGNOSIS — J449 Chronic obstructive pulmonary disease, unspecified: Secondary | ICD-10-CM | POA: Diagnosis not present

## 2023-01-19 DIAGNOSIS — F0390 Unspecified dementia without behavioral disturbance: Secondary | ICD-10-CM | POA: Diagnosis not present

## 2023-01-19 DIAGNOSIS — R269 Unspecified abnormalities of gait and mobility: Secondary | ICD-10-CM | POA: Diagnosis not present

## 2023-01-19 DIAGNOSIS — J45909 Unspecified asthma, uncomplicated: Secondary | ICD-10-CM | POA: Diagnosis not present

## 2023-01-24 DIAGNOSIS — I7091 Generalized atherosclerosis: Secondary | ICD-10-CM | POA: Diagnosis not present

## 2023-01-24 DIAGNOSIS — B351 Tinea unguium: Secondary | ICD-10-CM | POA: Diagnosis not present

## 2023-01-27 DIAGNOSIS — S81811A Laceration without foreign body, right lower leg, initial encounter: Secondary | ICD-10-CM | POA: Diagnosis not present

## 2023-01-27 DIAGNOSIS — R269 Unspecified abnormalities of gait and mobility: Secondary | ICD-10-CM | POA: Diagnosis not present

## 2023-01-27 DIAGNOSIS — F0390 Unspecified dementia without behavioral disturbance: Secondary | ICD-10-CM | POA: Diagnosis not present

## 2023-01-27 DIAGNOSIS — M6281 Muscle weakness (generalized): Secondary | ICD-10-CM | POA: Diagnosis not present

## 2023-02-02 DIAGNOSIS — F411 Generalized anxiety disorder: Secondary | ICD-10-CM | POA: Diagnosis not present

## 2023-02-02 DIAGNOSIS — F331 Major depressive disorder, recurrent, moderate: Secondary | ICD-10-CM | POA: Diagnosis not present

## 2023-02-02 DIAGNOSIS — F5101 Primary insomnia: Secondary | ICD-10-CM | POA: Diagnosis not present

## 2023-02-03 DIAGNOSIS — J449 Chronic obstructive pulmonary disease, unspecified: Secondary | ICD-10-CM | POA: Diagnosis not present

## 2023-02-07 DIAGNOSIS — I270 Primary pulmonary hypertension: Secondary | ICD-10-CM | POA: Diagnosis not present

## 2023-02-07 DIAGNOSIS — J4550 Severe persistent asthma, uncomplicated: Secondary | ICD-10-CM | POA: Diagnosis not present

## 2023-02-07 DIAGNOSIS — J301 Allergic rhinitis due to pollen: Secondary | ICD-10-CM | POA: Diagnosis not present

## 2023-02-07 DIAGNOSIS — R918 Other nonspecific abnormal finding of lung field: Secondary | ICD-10-CM | POA: Diagnosis not present

## 2023-02-14 DIAGNOSIS — Z79899 Other long term (current) drug therapy: Secondary | ICD-10-CM | POA: Diagnosis not present

## 2023-02-14 DIAGNOSIS — E559 Vitamin D deficiency, unspecified: Secondary | ICD-10-CM | POA: Diagnosis not present

## 2023-02-15 DIAGNOSIS — I10 Essential (primary) hypertension: Secondary | ICD-10-CM | POA: Diagnosis not present

## 2023-02-15 DIAGNOSIS — F339 Major depressive disorder, recurrent, unspecified: Secondary | ICD-10-CM | POA: Diagnosis not present

## 2023-02-23 DIAGNOSIS — B9620 Unspecified Escherichia coli [E. coli] as the cause of diseases classified elsewhere: Secondary | ICD-10-CM | POA: Diagnosis not present

## 2023-02-23 DIAGNOSIS — B9689 Other specified bacterial agents as the cause of diseases classified elsewhere: Secondary | ICD-10-CM | POA: Diagnosis not present

## 2023-02-23 DIAGNOSIS — B961 Klebsiella pneumoniae [K. pneumoniae] as the cause of diseases classified elsewhere: Secondary | ICD-10-CM | POA: Diagnosis not present

## 2023-02-23 DIAGNOSIS — N390 Urinary tract infection, site not specified: Secondary | ICD-10-CM | POA: Diagnosis not present

## 2023-02-23 DIAGNOSIS — F0390 Unspecified dementia without behavioral disturbance: Secondary | ICD-10-CM | POA: Diagnosis not present

## 2023-02-25 DIAGNOSIS — I10 Essential (primary) hypertension: Secondary | ICD-10-CM | POA: Diagnosis not present

## 2023-02-25 DIAGNOSIS — I272 Pulmonary hypertension, unspecified: Secondary | ICD-10-CM | POA: Diagnosis not present

## 2023-02-25 DIAGNOSIS — J449 Chronic obstructive pulmonary disease, unspecified: Secondary | ICD-10-CM | POA: Diagnosis not present

## 2023-02-25 DIAGNOSIS — E538 Deficiency of other specified B group vitamins: Secondary | ICD-10-CM | POA: Diagnosis not present

## 2023-03-02 ENCOUNTER — Telehealth (HOSPITAL_COMMUNITY): Payer: Self-pay

## 2023-03-02 DIAGNOSIS — R440 Auditory hallucinations: Secondary | ICD-10-CM | POA: Diagnosis not present

## 2023-03-02 DIAGNOSIS — F331 Major depressive disorder, recurrent, moderate: Secondary | ICD-10-CM | POA: Diagnosis not present

## 2023-03-02 DIAGNOSIS — N390 Urinary tract infection, site not specified: Secondary | ICD-10-CM | POA: Diagnosis not present

## 2023-03-02 DIAGNOSIS — F5101 Primary insomnia: Secondary | ICD-10-CM | POA: Diagnosis not present

## 2023-03-02 DIAGNOSIS — F411 Generalized anxiety disorder: Secondary | ICD-10-CM | POA: Diagnosis not present

## 2023-03-02 NOTE — Telephone Encounter (Signed)
  [facility] [address] [place] [address] Dept: [phone] Loc: [phone]  [name]  03/02/2023  You are scheduled for a Cardiac Catheterization on Tuesday, August 13 with Dr. Arvilla Meres.  1. Please arrive at the [facility] (Main Entrance A) at [facility]: [address] [place], [place] [address] at 10:00 AM (This time is 2 hour(s) before your procedure to ensure your preparation). Free valet parking service is available. You will check in at ADMITTING. The support person will be asked to wait in the waiting room.  It is OK to have someone drop you off and come back when you are ready to be discharged.    Special note: Every effort is made to have your procedure done on time. Please understand that emergencies sometimes delay scheduled procedures.  2. Diet: Do not eat solid foods after midnight.  The patient may have clear liquids until 5am upon the day of the procedure.   3. Medication instructions in preparation for your procedure:  DO NOT take your Lasix the day of your procedure.   On the morning of your procedure, take your and any morning medicines NOT listed above.  You may use sips of water.  4. Plan to go home the same day, you will only stay overnight if medically necessary. 5. Bring a current list of your medications and current insurance cards. 6. You MUST have a responsible person to drive you home. 7. Someone MUST be with you the first 24 hours after you arrive home or your discharge will be delayed. 8. Please wear clothes that are easy to get on and off and wear slip-on shoes.  Thank you for allowing [place] to care for you!   -- [facility]

## 2023-03-06 DIAGNOSIS — J449 Chronic obstructive pulmonary disease, unspecified: Secondary | ICD-10-CM | POA: Diagnosis not present

## 2023-03-06 NOTE — Telephone Encounter (Signed)
Facility aware of cath instructions

## 2023-03-07 ENCOUNTER — Ambulatory Visit (HOSPITAL_COMMUNITY)
Admission: RE | Admit: 2023-03-07 | Discharge: 2023-03-07 | Disposition: A | Discharge status: Skilled Nursing Facility | Payer: PPO | Attending: Internal Medicine | Admitting: Internal Medicine

## 2023-03-07 ENCOUNTER — Encounter (HOSPITAL_COMMUNITY)
Admission: RE | Disposition: A | Discharge status: Skilled Nursing Facility | Payer: Self-pay | Source: Home / Self Care | Attending: Internal Medicine

## 2023-03-07 ENCOUNTER — Other Ambulatory Visit: Payer: Self-pay

## 2023-03-07 DIAGNOSIS — Z86711 Personal history of pulmonary embolism: Secondary | ICD-10-CM | POA: Diagnosis not present

## 2023-03-07 DIAGNOSIS — N184 Chronic kidney disease, stage 4 (severe): Secondary | ICD-10-CM | POA: Insufficient documentation

## 2023-03-07 DIAGNOSIS — I5020 Unspecified systolic (congestive) heart failure: Secondary | ICD-10-CM

## 2023-03-07 DIAGNOSIS — Z79899 Other long term (current) drug therapy: Secondary | ICD-10-CM | POA: Diagnosis not present

## 2023-03-07 DIAGNOSIS — Z86718 Personal history of other venous thrombosis and embolism: Secondary | ICD-10-CM | POA: Diagnosis not present

## 2023-03-07 DIAGNOSIS — I130 Hypertensive heart and chronic kidney disease with heart failure and stage 1 through stage 4 chronic kidney disease, or unspecified chronic kidney disease: Secondary | ICD-10-CM | POA: Diagnosis not present

## 2023-03-07 DIAGNOSIS — Z7901 Long term (current) use of anticoagulants: Secondary | ICD-10-CM

## 2023-03-07 DIAGNOSIS — I5022 Chronic systolic (congestive) heart failure: Secondary | ICD-10-CM | POA: Diagnosis not present

## 2023-03-07 DIAGNOSIS — M797 Fibromyalgia: Secondary | ICD-10-CM

## 2023-03-07 DIAGNOSIS — I2721 Secondary pulmonary arterial hypertension: Secondary | ICD-10-CM | POA: Insufficient documentation

## 2023-03-07 DIAGNOSIS — Z9181 History of falling: Secondary | ICD-10-CM

## 2023-03-07 DIAGNOSIS — M810 Age-related osteoporosis without current pathological fracture: Secondary | ICD-10-CM

## 2023-03-07 DIAGNOSIS — G609 Hereditary and idiopathic neuropathy, unspecified: Secondary | ICD-10-CM

## 2023-03-07 HISTORY — PX: RIGHT HEART CATH: CATH118263

## 2023-03-07 LAB — CBC WITH DIFFERENTIAL/PLATELET
Abs Immature Granulocytes: 0.06 10*3/uL (ref 0.00–0.07)
Basophils Absolute: 0.1 10*3/uL (ref 0.0–0.1)
Basophils Relative: 1 %
Eosinophils Absolute: 0.5 10*3/uL (ref 0.0–0.5)
Eosinophils Relative: 4 %
HCT: 31.5 % — ABNORMAL LOW (ref 36.0–46.0)
Hemoglobin: 9.6 g/dL — ABNORMAL LOW (ref 12.0–15.0)
Immature Granulocytes: 1 %
Lymphocytes Relative: 18 %
Lymphs Abs: 2 10*3/uL (ref 0.7–4.0)
MCH: 28.5 pg (ref 26.0–34.0)
MCHC: 30.5 g/dL (ref 30.0–36.0)
MCV: 93.5 fL (ref 80.0–100.0)
Monocytes Absolute: 0.9 10*3/uL (ref 0.1–1.0)
Monocytes Relative: 8 %
Neutro Abs: 8 10*3/uL — ABNORMAL HIGH (ref 1.7–7.7)
Neutrophils Relative %: 68 %
Platelets: 285 10*3/uL (ref 150–400)
RBC: 3.37 MIL/uL — ABNORMAL LOW (ref 3.87–5.11)
RDW: 14.5 % (ref 11.5–15.5)
WBC: 11.5 10*3/uL — ABNORMAL HIGH (ref 4.0–10.5)
nRBC: 0 % (ref 0.0–0.2)

## 2023-03-07 LAB — POCT I-STAT EG7
Acid-Base Excess: 3 mmol/L — ABNORMAL HIGH (ref 0.0–2.0)
Acid-Base Excess: 3 mmol/L — ABNORMAL HIGH (ref 0.0–2.0)
Bicarbonate: 29.3 mmol/L — ABNORMAL HIGH (ref 20.0–28.0)
Bicarbonate: 29.4 mmol/L — ABNORMAL HIGH (ref 20.0–28.0)
Calcium, Ion: 1.16 mmol/L (ref 1.15–1.40)
Calcium, Ion: 1.22 mmol/L (ref 1.15–1.40)
HCT: 28 % — ABNORMAL LOW (ref 36.0–46.0)
HCT: 28 % — ABNORMAL LOW (ref 36.0–46.0)
Hemoglobin: 9.5 g/dL — ABNORMAL LOW (ref 12.0–15.0)
Hemoglobin: 9.5 g/dL — ABNORMAL LOW (ref 12.0–15.0)
O2 Saturation: 62 %
O2 Saturation: 65 %
Potassium: 4.3 mmol/L (ref 3.5–5.1)
Potassium: 4.4 mmol/L (ref 3.5–5.1)
Sodium: 140 mmol/L (ref 135–145)
Sodium: 141 mmol/L (ref 135–145)
TCO2: 31 mmol/L (ref 22–32)
TCO2: 31 mmol/L (ref 22–32)
pCO2, Ven: 54.1 mmHg (ref 44–60)
pCO2, Ven: 55.6 mmHg (ref 44–60)
pH, Ven: 7.331 (ref 7.25–7.43)
pH, Ven: 7.342 (ref 7.25–7.43)
pO2, Ven: 35 mmHg (ref 32–45)
pO2, Ven: 36 mmHg (ref 32–45)

## 2023-03-07 LAB — BASIC METABOLIC PANEL
Anion gap: 9 (ref 5–15)
BUN: 39 mg/dL — ABNORMAL HIGH (ref 8–23)
CO2: 29 mmol/L (ref 22–32)
Calcium: 8.7 mg/dL — ABNORMAL LOW (ref 8.9–10.3)
Chloride: 102 mmol/L (ref 98–111)
Creatinine, Ser: 1.85 mg/dL — ABNORMAL HIGH (ref 0.44–1.00)
GFR, Estimated: 27 mL/min — ABNORMAL LOW (ref >60)
Glucose, Bld: 95 mg/dL (ref 70–99)
Potassium: 4.2 mmol/L (ref 3.5–5.1)
Sodium: 140 mmol/L (ref 135–145)

## 2023-03-07 SURGERY — RIGHT HEART CATH
Anesthesia: LOCAL

## 2023-03-07 MED ORDER — SODIUM CHLORIDE 0.9 % IV SOLN: INTRAVENOUS | Status: DC

## 2023-03-07 MED ORDER — LIDOCAINE HCL (PF) 1 % IJ SOLN
INTRAMUSCULAR | Status: DC | PRN
  Administered 2023-03-07: 2 mL via INTRADERMAL
  Administered 2023-03-07: 5 mL via INTRADERMAL

## 2023-03-07 MED ORDER — LIDOCAINE HCL (PF) 1 % IJ SOLN
INTRAMUSCULAR | Status: AC
  Filled 2023-03-07: qty 30

## 2023-03-07 SURGICAL SUPPLY — 6 items
CATH BALLN WEDGE 5F 110CM (CATHETERS) IMPLANT
KIT MICROPUNCTURE NIT STIFF (SHEATH) IMPLANT
PACK CARDIAC CATHETERIZATION (CUSTOM PROCEDURE TRAY) ×1 IMPLANT
SHEATH GLIDE SLENDER 4/5FR (SHEATH) IMPLANT
TRANSDUCER W/STOPCOCK (MISCELLANEOUS) ×1 IMPLANT
WIRE MICROINTRODUCER 60CM (WIRE) IMPLANT

## 2023-03-07 NOTE — H&P (Signed)
Advanced Heart Failure Team History and Physical Note   PCP:  Crist Fat, MD    Reason for Visit: HF/Pulmonary HTN   HPI:    Ms. 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  Has been follow at [facility] by Dr. Desma Maxim  Recent echo 5/24 EF 35% RV normal   Chronic NYHA III symptoms.     Review of Systems: [y] = yes, [ ]  = no   General: Weight gain [ ] ; Weight loss [ ] ; Anorexia [ ] ; Fatigue [ y]; Fever [ ] ; Chills [ ] ; Weakness [ ]   Cardiac: Chest pain/pressure [ ] ; Resting SOB [ ] ; Exertional SOB Cove.Etienne ]; Orthopnea [ ] ; Pedal Edema [ y]; Palpitations [ ] ; Syncope [ ] ; Presyncope [ ] ; Paroxysmal nocturnal dyspnea[ ]   Pulmonary: Cough [ ] ; Wheezing[ ] ; Hemoptysis[ ] ; Sputum [ ] ; Snoring [ ]   GI: Vomiting[ ] ; Dysphagia[ ] ; Melena[ ] ; Hematochezia [ ] ; Heartburn[ ] ; Abdominal pain [ ] ; Constipation [ ] ; Diarrhea [ ] ; BRBPR [ ]   GU: Hematuria[ ] ; Dysuria [ ] ; Nocturia[ ]   Vascular: Pain in legs with walking [ ] ; Pain in feet with lying flat [ ] ; Non-healing sores [ ] ; Stroke [ ] ; TIA [ ] ; Slurred speech [ ] ;  Neuro: Headaches[ ] ; Vertigo[ ] ; Seizures[ ] ; Paresthesias[ ] ;Blurred vision [ ] ; Diplopia [ ] ; Vision changes [ ]   Ortho/Skin: Arthritis Cove.Etienne ]; Joint pain Cove.Etienne ]; Muscle pain [ ] ; Joint swelling [ ] ; Back Pain [ ] ; Rash [ ]   Psych: Depression[ y]; Anxiety[ y]  Heme: Bleeding problems [ ] ; Clotting disorders [ ] ; Anemia [ ]   Endocrine: Diabetes [ ] ; Thyroid dysfunction[ ]    Home Medications Prior to Admission medications   Medication Sig Start Date End Date Taking? Authorizing Provider  acetaminophen (TYLENOL) 325 MG tablet Take 650 mg by mouth every 6 (six) hours as needed for moderate pain or mild pain.   Yes [provider]  albuterol (VENTOLIN HFA) 108 (90 Base) MCG/ACT inhaler Inhale 2 puffs into the lungs 2 (two) times daily. Shake well/ Keep at bedside Additional 2 puff if needed for shortness  of breath 11/05/18  Yes [provider]  amLODipine (NORVASC) 5 MG tablet Take 5 mg by mouth daily. 04/12/17  Yes [provider]  BELSOMRA 15 MG TABS TAKE ONE TABLET BY MOUTH EVERY DAY AT BEDTIME Patient taking differently: Take 15 mg by mouth at bedtime. 09/27/19  Yes Cottle, Steva Ready., MD  carvedilol (COREG) 12.5 MG tablet Take 12.5 mg by mouth 2 (two) times daily. 03/02/23  Yes [provider]  Cholecalciferol (VITAMIN D3) 125 MCG (5000 UT) CAPS Take 5,000 Units by mouth daily.   Yes [provider]  Cyanocobalamin (VITAMIN B-12 IJ) Inject as directed every 30 (thirty) days.   Yes [provider]  diclofenac (VOLTAREN) 75 MG EC tablet Take 75 mg by mouth 2 (two) times daily.   Yes [provider]  famotidine (PEPCID) 40 MG tablet Take 40 mg by mouth at bedtime.  12/31/18  Yes [provider]  ferrous sulfate 325 (65 FE) MG tablet Take 325 mg by mouth 2 (two) times daily with a meal.   Yes [provider]  fluticasone (FLONASE) 50 MCG/ACT nasal spray Place 1 spray into both nostrils 2 (two) times daily.   Yes [provider]  Fluticasone-Umeclidin-Vilant (TRELEGY ELLIPTA) 100-62.5-25 MCG/ACT AEPB  Inhale 1 puff into the lungs every evening. 03/23/21  Yes [provider]  furosemide (LASIX) 20 MG tablet Take 20 mg by mouth 2 (two) times daily. 9 am and 1300   Yes [provider]  gabapentin (NEURONTIN) 400 MG capsule Take 400 mg by mouth 2 (two) times daily.   Yes [provider]  guaiFENesin (MUCUS RELIEF) 600 MG 12 hr tablet Take 600 mg by mouth 2 (two) times daily.   Yes [provider]  ipratropium-albuterol (DUONEB) 0.5-2.5 (3) MG/3ML SOLN Take 3 mLs by nebulization 3 (three) times daily as needed (Shortness of breath/ Wheezing).   Yes [provider]  LORazepam (ATIVAN) 0.5 MG tablet TAKE ONE TABLET BY MOUTH 4 TIMES DAILY AS NEEDED (MAX 4 TABLETS DAILY) Patient taking  differently: Take 0.5 mg by mouth every 8 (eight) hours as needed for anxiety. 09/27/19  Yes Cottle, Steva Ready., MD  LORazepam (ATIVAN) 1 MG tablet Take 1 mg by mouth every morning.   Yes [provider]  Menthol, Topical Analgesic, (BIOFREEZE) 10 % CREA Apply 1 Application topically 3 (three) times daily as needed (Pain).   Yes [provider]  mirtazapine (REMERON) 15 MG tablet Take 15 mg by mouth at bedtime.   Yes [provider]  montelukast (SINGULAIR) 10 MG tablet Take 10 mg by mouth daily.   Yes [provider]  nystatin powder Apply 1 Application topically 3 (three) times daily as needed (New rash/ Under breast, abdomem fold).   Yes [provider]  omeprazole (PRILOSEC) 40 MG capsule Take 40 mg by mouth daily. 10/29/19  Yes [provider]  ondansetron (ZOFRAN) 4 MG tablet Take 4 mg by mouth every 8 (eight) hours as needed for nausea or vomiting.   Yes [provider]  OXYGEN Inhale 2 L into the lungs daily as needed (if oxygen less than 90%).   Yes [provider]  potassium chloride (KLOR-CON) 20 MEQ packet Take 20 mEq by mouth 2 (two) times daily. 8 am & 12 pm   Yes [provider]  Pramox-PE-Glycerin-Petrolatum (HEMORRHOIDAL) 1-0.25-14.4-15 % CREA Apply 1 application  topically daily as needed (Hemorrhoids).   Yes [provider]  pravastatin (PRAVACHOL) 40 MG tablet Take 40 mg by mouth daily.   Yes [provider]  QUEtiapine (SEROQUEL) 200 MG tablet TAKE 2 TABLETS BY MOUTH EVERY DAY AT BEDTIME Patient taking differently: Take 100 mg by mouth at bedtime. 12/02/19  Yes Cottle, Steva Ready., MD  sertraline (ZOLOFT) 25 MG tablet Take 25 mg by mouth at bedtime. 03/02/23  Yes [provider]  venlafaxine XR (EFFEXOR-XR) 75 MG 24 hr capsule TAKE ONE CAPSULE (7G MG TOTAL) BY MOUTH DAILY WITH BREAKFAST Patient taking differently: Take 150 mg by mouth daily. 04/29/19  Yes Cottle, Steva Ready., MD   Wheat Dextrin (BENEFIBER ON THE GO) POWD Take 1 Package by mouth daily. In 8 oz of liquid Drink 08/23/22  Yes [provider]    Past Medical History: Past Medical History:  Diagnosis Date   Anemia 2020   Anxiety    Asthma    Depression    Fibromyalgia    GERD (gastroesophageal reflux disease)    Hyperlipidemia    Hypertension    Migraines    Osteoporosis    Ovarian cyst    Pulmonary emboli (HCC)    Urinary tract infection     Past Surgical History: Past Surgical History:  Procedure Laterality Date   APPENDECTOMY  BACK SURGERY     CHOLECYSTECTOMY     CHOLECYSTECTOMY, LAPAROSCOPIC     FEMUR FRACTURE SURGERY Right    IVC FILTER INSERTION N/A 12/30/2019   Procedure: IVC FILTER INSERTION;  Surgeon: Maeola Harman, MD;  Location: [facility];  Service: Cardiovascular;  Laterality: N/A;   MEDIAL PARTIAL KNEE REPLACEMENT     NECK SURGERY     PARTIAL HIP ARTHROPLASTY Right    TONSILLECTOMY AND ADENOIDECTOMY     Pt. stated,   VAGINAL HYSTERECTOMY      Family History:  Family History  Problem Relation Age of Onset   Heart Problems Mother    Diabetes type II Father    Mental retardation Son     Social History: Social History   Socioeconomic History   Marital status: Widowed    Spouse name: Not on file   Number of children: Not on file   Years of education: Not on file   Highest education level: Not on file  Occupational History   Not on file  Tobacco Use   Smoking status: Never   Smokeless tobacco: Never  Vaping Use   Vaping status: Never Used  Substance and Sexual Activity   Alcohol use: Not Currently   Drug use: Never   Sexual activity: Not Currently    Birth control/protection: Surgical    Comment: Hysterectomy  Other Topics Concern   Not on file  Social History Narrative   Not on file   Social Determinants of Health   Financial Resource Strain: Not on file  Food Insecurity: Not on file  Transportation Needs: Not on  file  Physical Activity: Not on file  Stress: Not on file  Social Connections: Not on file    Allergies:  Allergies  Allergen Reactions   Morphine Nausea And Vomiting   Sulfa Antibiotics Rash    Objective:    Vital Signs:   Temp:  [98.3 F (36.8 C)] 98.3 F (36.8 C) (08/13 1120) Pulse Rate:  [50] 50 (08/13 1120) Resp:  [16] 16 (08/13 1120) BP: (125)/(56) 125/56 (08/13 1120) SpO2:  [92 %] 92 % (08/13 1120) Weight:  [64.4 kg] 64.4 kg (08/13 1120)   Filed Weights   03/07/23 1120  Weight: 64.4 kg     Physical Exam     General: Elderly woman No respiratory difficulty HEENT: Normal Neck: Supple. no JVD. Carotids 2+ bilat; no bruits. No lymphadenopathy or thyromegaly appreciated. Cor: PMI nondisplaced. Regular rate & rhythm. No rubs, gallops or murmurs. Lungs: Clear Abdomen: Soft, nontender, nondistended. No hepatosplenomegaly. No bruits or masses. Good bowel sounds. Extremities: No cyanosis, clubbing, rash, edema Neuro: Alert & oriented x 3, cranial nerves grossly intact. moves all 4 extremities w/o difficulty. Affect pleasant.  Labs     Basic Metabolic Panel: Recent Labs  Lab 03/07/23 1132  NA 140  K 4.2  CL 102  CO2 29  GLUCOSE 95  BUN 39*  CREATININE 1.85*  CALCIUM 8.7*    Liver Function Tests: No results for input(s): "AST", "ALT", "ALKPHOS", "BILITOT", "PROT", "ALBUMIN" in the last 168 hours. No results for input(s): "LIPASE", "AMYLASE" in the last 168 hours. No results for input(s): "AMMONIA" in the last 168 hours.  CBC: Recent Labs  Lab 03/07/23 1132  WBC 11.5*  NEUTROABS 8.0*  HGB 9.6*  HCT 31.5*  MCV 93.5  PLT 285    Cardiac Enzymes: No results for input(s): "CKTOTAL", "CKMB", "CKMBINDEX", "TROPONINI" in the last 168 hours.  BNP: BNP (last 3 results)  No results for input(s): "BNP" in the last 8760 hours.  ProBNP (last 3 results) No results for input(s): "PROBNP" in the last 8760 hours.   CBG: No results for input(s): "GLUCAP"  in the last 168 hours.  Coagulation Studies: No results for input(s): "LABPROT", "INR" in the last 72 hours.  Imaging: No results found.  Assessment/Plan   1. PAH 2. Chronic systolic HF with EF 35% - Followed by Dr. Desma Maxim - unclear if ischemic or NICM  - on carvedilol but no other GDMT - NYHA III 3. H/o PE/DVT  4. CKD 4  Will proceed with RHC to better assess hemodynamics but will need further w/u of her HF and titration of GDMT as tolerated.   Arvilla Meres, MD 03/07/2023, 12:29 PM  Advanced Heart Failure Team Pager [phone] (M-F; 7a - 5p)  Please contact [facility] for night-coverage after hours (4p -7a ) and weekends on [email or web address]

## 2023-03-07 NOTE — Discharge Instructions (Signed)

## 2023-03-08 ENCOUNTER — Encounter (HOSPITAL_COMMUNITY): Payer: Self-pay | Admitting: Internal Medicine

## 2023-03-09 DIAGNOSIS — R4182 Altered mental status, unspecified: Secondary | ICD-10-CM | POA: Diagnosis not present

## 2023-03-09 DIAGNOSIS — Z87440 Personal history of urinary (tract) infections: Secondary | ICD-10-CM | POA: Diagnosis not present

## 2023-03-09 DIAGNOSIS — B9620 Unspecified Escherichia coli [E. coli] as the cause of diseases classified elsewhere: Secondary | ICD-10-CM | POA: Diagnosis not present

## 2023-03-09 DIAGNOSIS — N390 Urinary tract infection, site not specified: Secondary | ICD-10-CM | POA: Diagnosis not present

## 2023-03-16 DIAGNOSIS — F0390 Unspecified dementia without behavioral disturbance: Secondary | ICD-10-CM | POA: Diagnosis not present

## 2023-03-16 DIAGNOSIS — M6281 Muscle weakness (generalized): Secondary | ICD-10-CM | POA: Diagnosis not present

## 2023-03-16 DIAGNOSIS — W19XXXA Unspecified fall, initial encounter: Secondary | ICD-10-CM | POA: Diagnosis not present

## 2023-03-16 DIAGNOSIS — R2689 Other abnormalities of gait and mobility: Secondary | ICD-10-CM | POA: Diagnosis not present

## 2023-03-16 DIAGNOSIS — Z9181 History of falling: Secondary | ICD-10-CM | POA: Diagnosis not present

## 2023-03-17 ENCOUNTER — Encounter (HOSPITAL_COMMUNITY): Payer: PPO | Admitting: Internal Medicine

## 2023-03-23 DIAGNOSIS — F0390 Unspecified dementia without behavioral disturbance: Secondary | ICD-10-CM

## 2023-03-23 DIAGNOSIS — R600 Localized edema: Secondary | ICD-10-CM

## 2023-03-23 DIAGNOSIS — R635 Abnormal weight gain: Secondary | ICD-10-CM

## 2023-03-30 DIAGNOSIS — F331 Major depressive disorder, recurrent, moderate: Secondary | ICD-10-CM | POA: Diagnosis not present

## 2023-03-30 DIAGNOSIS — F0390 Unspecified dementia without behavioral disturbance: Secondary | ICD-10-CM | POA: Diagnosis not present

## 2023-03-30 DIAGNOSIS — G4700 Insomnia, unspecified: Secondary | ICD-10-CM | POA: Diagnosis not present

## 2023-03-30 DIAGNOSIS — F411 Generalized anxiety disorder: Secondary | ICD-10-CM | POA: Diagnosis not present

## 2023-03-30 DIAGNOSIS — R440 Auditory hallucinations: Secondary | ICD-10-CM | POA: Diagnosis not present

## 2023-04-01 DIAGNOSIS — N390 Urinary tract infection, site not specified: Secondary | ICD-10-CM | POA: Diagnosis not present

## 2023-04-06 ENCOUNTER — Encounter (HOSPITAL_COMMUNITY): Payer: Self-pay | Admitting: Internal Medicine

## 2023-04-06 ENCOUNTER — Ambulatory Visit (HOSPITAL_COMMUNITY)
Admission: RE | Admit: 2023-04-06 | Discharge: 2023-04-06 | Disposition: A | Discharge status: Home / Self Care | Payer: PPO | Source: Ambulatory Visit | Attending: Internal Medicine | Admitting: Internal Medicine

## 2023-04-06 VITALS — BP 130/68 | HR 61 | Wt 156.0 lb

## 2023-04-06 DIAGNOSIS — I5022 Chronic systolic (congestive) heart failure: Secondary | ICD-10-CM | POA: Diagnosis not present

## 2023-04-06 DIAGNOSIS — I130 Hypertensive heart and chronic kidney disease with heart failure and stage 1 through stage 4 chronic kidney disease, or unspecified chronic kidney disease: Secondary | ICD-10-CM | POA: Insufficient documentation

## 2023-04-06 DIAGNOSIS — Z87440 Personal history of urinary (tract) infections: Secondary | ICD-10-CM

## 2023-04-06 DIAGNOSIS — I2721 Secondary pulmonary arterial hypertension: Secondary | ICD-10-CM | POA: Diagnosis not present

## 2023-04-06 DIAGNOSIS — F0390 Unspecified dementia without behavioral disturbance: Secondary | ICD-10-CM

## 2023-04-06 DIAGNOSIS — B9620 Unspecified Escherichia coli [E. coli] as the cause of diseases classified elsewhere: Secondary | ICD-10-CM

## 2023-04-06 DIAGNOSIS — R9431 Abnormal electrocardiogram [ECG] [EKG]: Secondary | ICD-10-CM | POA: Diagnosis not present

## 2023-04-06 DIAGNOSIS — N184 Chronic kidney disease, stage 4 (severe): Secondary | ICD-10-CM | POA: Insufficient documentation

## 2023-04-06 DIAGNOSIS — J449 Chronic obstructive pulmonary disease, unspecified: Secondary | ICD-10-CM | POA: Diagnosis not present

## 2023-04-06 DIAGNOSIS — J439 Emphysema, unspecified: Secondary | ICD-10-CM

## 2023-04-06 DIAGNOSIS — N390 Urinary tract infection, site not specified: Secondary | ICD-10-CM

## 2023-04-06 DIAGNOSIS — B9689 Other specified bacterial agents as the cause of diseases classified elsewhere: Secondary | ICD-10-CM

## 2023-04-06 NOTE — Progress Notes (Signed)
ADVANCED HF CLINIC NOTE  Referring Physician: Dr. Blenda Nicely Primary Care: Leonia Reader, Barbara Cower, MD Primary Cardiologist: None  HPI:  Ms. 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   Has been follow at [facility] by Dr. Desma Maxim  Echo 2016 EF 55-60%  Echo 2022 EF 35%  Echo 5/24 (at [facility])  EF 35% RV normal    Resides at [facility]. Has been there for 2 years. Gets around with WC due hip pain and leg weakness. Mild edema. No orthopnea or PND. No CP. Never smoked. 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     RA = 7 RV = 45/10 PA = 40/10 (25) PCW = 7 Fick cardiac output/index = 5.3/3.3 PVR = 3.4 WU FA sat = 95% PA sat = 62%, 64% PAPi = 4.3    Past Medical History:  Diagnosis Date   Anemia 2020   Anxiety    Asthma    Depression    Fibromyalgia    GERD (gastroesophageal reflux disease)    Hyperlipidemia    Hypertension    Migraines    Osteoporosis    Ovarian cyst    Pulmonary emboli (HCC)    Urinary tract infection     Current Outpatient Medications  Medication Sig Dispense Refill   acetaminophen (TYLENOL) 325 MG tablet Take 650 mg by mouth every 6 (six) hours as needed for moderate pain or mild pain.     albuterol (VENTOLIN HFA) 108 (90 Base) MCG/ACT inhaler Inhale 2 puffs into the lungs 2 (two) times daily. Shake well/ Keep at bedside Additional 2 puff if needed for shortness of breath     amLODipine (NORVASC) 5 MG tablet Take 5 mg by mouth daily.  11   BELSOMRA 15 MG TABS TAKE ONE TABLET BY MOUTH EVERY DAY AT BEDTIME 30 tablet 5   carvedilol (COREG) 12.5 MG tablet Take 12.5 mg by mouth 2 (two) times daily.     Cholecalciferol (VITAMIN D3) 125 MCG (5000 UT) CAPS Take 5,000 Units by mouth daily.     Cyanocobalamin (VITAMIN B-12 IJ) Inject as directed every 30 (thirty) days.     diclofenac (VOLTAREN) 75 MG EC tablet Take 75 mg by mouth 2 (two) times daily.      famotidine (PEPCID) 40 MG tablet Take 40 mg by mouth at bedtime.      ferrous sulfate 325 (65 FE) MG tablet Take 325 mg by mouth 2 (two) times daily with a meal.     fluticasone (FLONASE) 50 MCG/ACT nasal spray Place 1 spray into both nostrils 2 (two) times daily.     Fluticasone-Umeclidin-Vilant (TRELEGY ELLIPTA) 100-62.5-25 MCG/ACT AEPB Inhale 1 puff into the lungs every evening.     furosemide (LASIX) 40 MG tablet Take 40 mg by mouth 2 (two) times daily.     gabapentin (NEURONTIN) 400 MG capsule Take 400 mg by mouth 2 (two) times daily.     guaiFENesin (MUCUS RELIEF) 600 MG 12 hr tablet Take 600 mg by mouth 2 (two) times daily.     ipratropium-albuterol (DUONEB) 0.5-2.5 (3) MG/3ML SOLN Take 3 mLs by nebulization 3 (three) times daily as needed (Shortness of breath/ Wheezing).     LORazepam (ATIVAN) 1 MG tablet Take 1 mg by mouth in the morning.     Menthol, Topical Analgesic, (BIOFREEZE) 10 % CREA Apply 1 Application topically 3 (three) times  daily as needed (Pain).     mirtazapine (REMERON) 15 MG tablet Take 15 mg by mouth at bedtime.     montelukast (SINGULAIR) 10 MG tablet Take 10 mg by mouth daily.     nystatin powder Apply 1 Application topically 3 (three) times daily as needed (New rash/ Under breast, abdomem fold).     omeprazole (PRILOSEC) 40 MG capsule Take 40 mg by mouth daily.     ondansetron (ZOFRAN) 4 MG tablet Take 4 mg by mouth every 8 (eight) hours as needed for nausea or vomiting.     OXYGEN Inhale 2 L into the lungs daily as needed (if oxygen less than 90%).     potassium chloride (KLOR-CON) 20 MEQ packet Take 20 mEq by mouth 2 (two) times daily. 8 am & 12 pm     Pramox-PE-Glycerin-Petrolatum (HEMORRHOIDAL) 1-0.25-14.4-15 % CREA Apply 1 application  topically daily as needed (Hemorrhoids).     pravastatin (PRAVACHOL) 40 MG tablet Take 40 mg by mouth daily.     QUEtiapine (SEROQUEL) 100 MG tablet Take 100 mg by mouth at bedtime.     sertraline (ZOLOFT) 25 MG tablet Take 25  mg by mouth at bedtime.     venlafaxine XR (EFFEXOR-XR) 150 MG 24 hr capsule Take 150 mg by mouth daily with breakfast.     Wheat Dextrin (BENEFIBER ON THE GO) POWD Take 1 Package by mouth daily. In 8 oz of liquid Drink     No current facility-administered medications for this encounter.    Allergies  Allergen Reactions   Morphine Nausea And Vomiting   Sulfa Antibiotics Rash      Social History   Socioeconomic History   Marital status: Widowed    Spouse name: Not on file   Number of children: Not on file   Years of education: Not on file   Highest education level: Not on file  Occupational History   Not on file  Tobacco Use   Smoking status: Never   Smokeless tobacco: Never  Vaping Use   Vaping status: Never Used  Substance and Sexual Activity   Alcohol use: Not Currently   Drug use: Never   Sexual activity: Not Currently    Birth control/protection: Surgical    Comment: Hysterectomy  Other Topics Concern   Not on file  Social History Narrative   Not on file   Social Determinants of Health   Financial Resource Strain: Not on file  Food Insecurity: Not on file  Transportation Needs: Not on file  Physical Activity: Not on file  Stress: Not on file  Social Connections: Not on file  Intimate Partner Violence: Not on file      Family History  Problem Relation Age of Onset   Heart Problems Mother    Diabetes type II Father    Mental retardation Son     Vitals:   04/06/23 1432  BP: 130/68  Pulse: 61  SpO2: 96%  Weight: 70.8 kg (156 lb)    PHYSICAL EXAM: General:  Elderly HOH. Sitting in WC No respiratory difficulty HEENT: normal Neck: supple. no JVD. Carotids 2+ bilat; no bruits. No lymphadenopathy or thryomegaly appreciated. Cor: PMI nondisplaced. Regular rate & rhythm. No rubs, gallops or murmurs. Lungs: clear Abdomen: soft, nontender, nondistended. No hepatosplenomegaly. No bruits or masses. Good bowel sounds. Extremities: no cyanosis, clubbing,  rash, tr edema on R, 1+ edema on left  Neuro: alert & oriented x 3, cranial nerves grossly intact. moves all 4 extremities w/o difficulty.  Affect pleasant.  ECG: NSR 60 Low Qrs No ST-T wave abnormalities. 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 Currently asymptomatic.  - Would keep O2 sats >= 90% - Would not recommend further w/u or treatment at this point  2. Systolic HF - EF 16% on echo - has not had ischemic evaluation but no current s/s of angina. Given age and debility I doubt ischemic w/u would change management - Followed by Dr. Desma Maxim - NYHA III but mostly due to muscular weakness - Would consider further titration of GDMT as tolerated  Can f/u as needed  Total time spent 40 minutes. Over half that time spent discussing above.    Arvilla Meres, MD  3:02 PM

## 2023-04-06 NOTE — Patient Instructions (Signed)
Great to see you today!!!  Follow up as needed.

## 2023-04-12 DIAGNOSIS — F339 Major depressive disorder, recurrent, unspecified: Secondary | ICD-10-CM | POA: Diagnosis not present

## 2023-04-12 DIAGNOSIS — I10 Essential (primary) hypertension: Secondary | ICD-10-CM | POA: Diagnosis not present

## 2023-04-20 DIAGNOSIS — R5381 Other malaise: Secondary | ICD-10-CM | POA: Diagnosis not present

## 2023-04-20 DIAGNOSIS — D509 Iron deficiency anemia, unspecified: Secondary | ICD-10-CM | POA: Diagnosis not present

## 2023-04-20 DIAGNOSIS — E559 Vitamin D deficiency, unspecified: Secondary | ICD-10-CM | POA: Diagnosis not present

## 2023-04-20 DIAGNOSIS — F0390 Unspecified dementia without behavioral disturbance: Secondary | ICD-10-CM | POA: Diagnosis not present

## 2023-04-20 DIAGNOSIS — E538 Deficiency of other specified B group vitamins: Secondary | ICD-10-CM | POA: Diagnosis not present

## 2023-04-25 DIAGNOSIS — Z79899 Other long term (current) drug therapy: Secondary | ICD-10-CM | POA: Diagnosis not present

## 2023-04-25 DIAGNOSIS — E559 Vitamin D deficiency, unspecified: Secondary | ICD-10-CM | POA: Diagnosis not present

## 2023-04-25 DIAGNOSIS — E782 Mixed hyperlipidemia: Secondary | ICD-10-CM | POA: Diagnosis not present

## 2023-04-25 DIAGNOSIS — D518 Other vitamin B12 deficiency anemias: Secondary | ICD-10-CM | POA: Diagnosis not present

## 2023-04-25 DIAGNOSIS — E119 Type 2 diabetes mellitus without complications: Secondary | ICD-10-CM | POA: Diagnosis not present

## 2023-04-25 DIAGNOSIS — E038 Other specified hypothyroidism: Secondary | ICD-10-CM | POA: Diagnosis not present

## 2023-04-25 DIAGNOSIS — E612 Magnesium deficiency: Secondary | ICD-10-CM | POA: Diagnosis not present

## 2023-04-26 DIAGNOSIS — F0393 Unspecified dementia, unspecified severity, with mood disturbance: Secondary | ICD-10-CM | POA: Diagnosis not present

## 2023-04-26 DIAGNOSIS — J449 Chronic obstructive pulmonary disease, unspecified: Secondary | ICD-10-CM | POA: Diagnosis not present

## 2023-04-26 DIAGNOSIS — E538 Deficiency of other specified B group vitamins: Secondary | ICD-10-CM | POA: Diagnosis not present

## 2023-04-26 DIAGNOSIS — I272 Pulmonary hypertension, unspecified: Secondary | ICD-10-CM | POA: Diagnosis not present

## 2023-04-27 DIAGNOSIS — G4700 Insomnia, unspecified: Secondary | ICD-10-CM | POA: Diagnosis not present

## 2023-04-27 DIAGNOSIS — F03B4 Unspecified dementia, moderate, with anxiety: Secondary | ICD-10-CM | POA: Diagnosis not present

## 2023-04-27 DIAGNOSIS — F331 Major depressive disorder, recurrent, moderate: Secondary | ICD-10-CM | POA: Diagnosis not present

## 2023-05-06 DIAGNOSIS — J449 Chronic obstructive pulmonary disease, unspecified: Secondary | ICD-10-CM | POA: Diagnosis not present

## 2023-05-17 DIAGNOSIS — R0602 Shortness of breath: Secondary | ICD-10-CM | POA: Diagnosis not present

## 2023-05-17 DIAGNOSIS — I444 Left anterior fascicular block: Secondary | ICD-10-CM | POA: Diagnosis not present

## 2023-05-17 DIAGNOSIS — D649 Anemia, unspecified: Secondary | ICD-10-CM | POA: Diagnosis not present

## 2023-05-17 DIAGNOSIS — R2689 Other abnormalities of gait and mobility: Secondary | ICD-10-CM | POA: Diagnosis not present

## 2023-05-17 DIAGNOSIS — E785 Hyperlipidemia, unspecified: Secondary | ICD-10-CM | POA: Diagnosis not present

## 2023-05-17 DIAGNOSIS — Z79899 Other long term (current) drug therapy: Secondary | ICD-10-CM | POA: Diagnosis not present

## 2023-05-17 DIAGNOSIS — N179 Acute kidney failure, unspecified: Secondary | ICD-10-CM | POA: Diagnosis not present

## 2023-05-17 DIAGNOSIS — I10 Essential (primary) hypertension: Secondary | ICD-10-CM | POA: Diagnosis not present

## 2023-05-17 DIAGNOSIS — J690 Pneumonitis due to inhalation of food and vomit: Secondary | ICD-10-CM | POA: Diagnosis not present

## 2023-05-17 DIAGNOSIS — K219 Gastro-esophageal reflux disease without esophagitis: Secondary | ICD-10-CM | POA: Diagnosis not present

## 2023-05-17 DIAGNOSIS — R918 Other nonspecific abnormal finding of lung field: Secondary | ICD-10-CM | POA: Diagnosis not present

## 2023-05-17 DIAGNOSIS — F03C0 Unspecified dementia, severe, without behavioral disturbance, psychotic disturbance, mood disturbance, and anxiety: Secondary | ICD-10-CM | POA: Diagnosis not present

## 2023-05-17 DIAGNOSIS — D508 Other iron deficiency anemias: Secondary | ICD-10-CM | POA: Diagnosis not present

## 2023-05-17 DIAGNOSIS — K529 Noninfective gastroenteritis and colitis, unspecified: Secondary | ICD-10-CM | POA: Diagnosis not present

## 2023-05-17 DIAGNOSIS — R9431 Abnormal electrocardiogram [ECG] [EKG]: Secondary | ICD-10-CM | POA: Diagnosis not present

## 2023-05-17 DIAGNOSIS — Z9981 Dependence on supplemental oxygen: Secondary | ICD-10-CM | POA: Diagnosis not present

## 2023-05-17 DIAGNOSIS — Z20822 Contact with and (suspected) exposure to covid-19: Secondary | ICD-10-CM | POA: Diagnosis not present

## 2023-05-17 DIAGNOSIS — J9601 Acute respiratory failure with hypoxia: Secondary | ICD-10-CM | POA: Diagnosis not present

## 2023-05-17 DIAGNOSIS — R0902 Hypoxemia: Secondary | ICD-10-CM | POA: Diagnosis not present

## 2023-05-18 DIAGNOSIS — N281 Cyst of kidney, acquired: Secondary | ICD-10-CM | POA: Diagnosis not present

## 2023-05-18 DIAGNOSIS — K7689 Other specified diseases of liver: Secondary | ICD-10-CM | POA: Diagnosis not present

## 2023-05-18 DIAGNOSIS — K449 Diaphragmatic hernia without obstruction or gangrene: Secondary | ICD-10-CM | POA: Diagnosis not present

## 2023-05-18 DIAGNOSIS — J690 Pneumonitis due to inhalation of food and vomit: Secondary | ICD-10-CM | POA: Diagnosis not present

## 2023-05-18 DIAGNOSIS — J9601 Acute respiratory failure with hypoxia: Secondary | ICD-10-CM | POA: Diagnosis not present

## 2023-05-18 DIAGNOSIS — K529 Noninfective gastroenteritis and colitis, unspecified: Secondary | ICD-10-CM | POA: Diagnosis not present

## 2023-05-18 DIAGNOSIS — N2889 Other specified disorders of kidney and ureter: Secondary | ICD-10-CM | POA: Diagnosis not present

## 2023-05-19 DIAGNOSIS — R531 Weakness: Secondary | ICD-10-CM | POA: Diagnosis not present

## 2023-05-19 DIAGNOSIS — J9601 Acute respiratory failure with hypoxia: Secondary | ICD-10-CM | POA: Diagnosis not present

## 2023-05-19 DIAGNOSIS — R0602 Shortness of breath: Secondary | ICD-10-CM | POA: Diagnosis not present

## 2023-05-19 DIAGNOSIS — Z7401 Bed confinement status: Secondary | ICD-10-CM | POA: Diagnosis not present

## 2023-05-19 DIAGNOSIS — K529 Noninfective gastroenteritis and colitis, unspecified: Secondary | ICD-10-CM | POA: Diagnosis not present

## 2023-05-19 DIAGNOSIS — J690 Pneumonitis due to inhalation of food and vomit: Secondary | ICD-10-CM | POA: Diagnosis not present

## 2023-05-22 DIAGNOSIS — J9601 Acute respiratory failure with hypoxia: Secondary | ICD-10-CM | POA: Diagnosis not present

## 2023-05-22 DIAGNOSIS — J9622 Acute and chronic respiratory failure with hypercapnia: Secondary | ICD-10-CM | POA: Diagnosis not present

## 2023-05-22 DIAGNOSIS — J690 Pneumonitis due to inhalation of food and vomit: Secondary | ICD-10-CM | POA: Diagnosis not present

## 2023-05-22 DIAGNOSIS — J4489 Other specified chronic obstructive pulmonary disease: Secondary | ICD-10-CM | POA: Diagnosis not present

## 2023-05-23 DIAGNOSIS — I10 Essential (primary) hypertension: Secondary | ICD-10-CM | POA: Diagnosis not present

## 2023-05-23 DIAGNOSIS — F339 Major depressive disorder, recurrent, unspecified: Secondary | ICD-10-CM | POA: Diagnosis not present

## 2023-05-24 DIAGNOSIS — K529 Noninfective gastroenteritis and colitis, unspecified: Secondary | ICD-10-CM | POA: Diagnosis not present

## 2023-05-24 DIAGNOSIS — E785 Hyperlipidemia, unspecified: Secondary | ICD-10-CM | POA: Diagnosis not present

## 2023-05-24 DIAGNOSIS — I10 Essential (primary) hypertension: Secondary | ICD-10-CM | POA: Diagnosis not present

## 2023-05-24 DIAGNOSIS — J9601 Acute respiratory failure with hypoxia: Secondary | ICD-10-CM | POA: Diagnosis not present

## 2023-05-24 DIAGNOSIS — F32A Depression, unspecified: Secondary | ICD-10-CM | POA: Diagnosis not present

## 2023-05-24 DIAGNOSIS — S0180XD Unspecified open wound of other part of head, subsequent encounter: Secondary | ICD-10-CM | POA: Diagnosis not present

## 2023-05-24 DIAGNOSIS — Z7952 Long term (current) use of systemic steroids: Secondary | ICD-10-CM | POA: Diagnosis not present

## 2023-05-24 DIAGNOSIS — J4489 Other specified chronic obstructive pulmonary disease: Secondary | ICD-10-CM | POA: Diagnosis not present

## 2023-05-24 DIAGNOSIS — K219 Gastro-esophageal reflux disease without esophagitis: Secondary | ICD-10-CM | POA: Diagnosis not present

## 2023-05-24 DIAGNOSIS — F03C3 Unspecified dementia, severe, with mood disturbance: Secondary | ICD-10-CM | POA: Diagnosis not present

## 2023-05-24 DIAGNOSIS — J690 Pneumonitis due to inhalation of food and vomit: Secondary | ICD-10-CM | POA: Diagnosis not present

## 2023-05-24 DIAGNOSIS — Z9181 History of falling: Secondary | ICD-10-CM | POA: Diagnosis not present

## 2023-05-24 DIAGNOSIS — D509 Iron deficiency anemia, unspecified: Secondary | ICD-10-CM | POA: Diagnosis not present

## 2023-05-24 DIAGNOSIS — Z9981 Dependence on supplemental oxygen: Secondary | ICD-10-CM | POA: Diagnosis not present

## 2023-05-24 DIAGNOSIS — Z7951 Long term (current) use of inhaled steroids: Secondary | ICD-10-CM | POA: Diagnosis not present

## 2023-05-24 DIAGNOSIS — Z79899 Other long term (current) drug therapy: Secondary | ICD-10-CM | POA: Diagnosis not present

## 2023-05-24 DIAGNOSIS — J9622 Acute and chronic respiratory failure with hypercapnia: Secondary | ICD-10-CM | POA: Diagnosis not present

## 2023-05-25 DIAGNOSIS — F331 Major depressive disorder, recurrent, moderate: Secondary | ICD-10-CM | POA: Diagnosis not present

## 2023-05-25 DIAGNOSIS — F03B4 Unspecified dementia, moderate, with anxiety: Secondary | ICD-10-CM | POA: Diagnosis not present

## 2023-05-25 DIAGNOSIS — M6281 Muscle weakness (generalized): Secondary | ICD-10-CM

## 2023-05-25 DIAGNOSIS — J189 Pneumonia, unspecified organism: Secondary | ICD-10-CM

## 2023-05-25 DIAGNOSIS — F419 Anxiety disorder, unspecified: Secondary | ICD-10-CM

## 2023-05-25 DIAGNOSIS — G4700 Insomnia, unspecified: Secondary | ICD-10-CM | POA: Diagnosis not present

## 2023-05-25 DIAGNOSIS — F0390 Unspecified dementia without behavioral disturbance: Secondary | ICD-10-CM

## 2023-05-25 DIAGNOSIS — G9341 Metabolic encephalopathy: Secondary | ICD-10-CM

## 2023-06-06 DIAGNOSIS — J449 Chronic obstructive pulmonary disease, unspecified: Secondary | ICD-10-CM | POA: Diagnosis not present

## 2023-06-08 DIAGNOSIS — R5381 Other malaise: Secondary | ICD-10-CM | POA: Diagnosis not present

## 2023-06-08 DIAGNOSIS — R600 Localized edema: Secondary | ICD-10-CM | POA: Diagnosis not present

## 2023-06-08 DIAGNOSIS — M6281 Muscle weakness (generalized): Secondary | ICD-10-CM | POA: Diagnosis not present

## 2023-06-08 DIAGNOSIS — F0390 Unspecified dementia without behavioral disturbance: Secondary | ICD-10-CM | POA: Diagnosis not present

## 2023-06-09 DIAGNOSIS — J189 Pneumonia, unspecified organism: Secondary | ICD-10-CM | POA: Diagnosis not present

## 2023-06-09 DIAGNOSIS — R457 State of emotional shock and stress, unspecified: Secondary | ICD-10-CM | POA: Diagnosis not present

## 2023-06-09 DIAGNOSIS — R9431 Abnormal electrocardiogram [ECG] [EKG]: Secondary | ICD-10-CM | POA: Diagnosis not present

## 2023-06-09 DIAGNOSIS — R069 Unspecified abnormalities of breathing: Secondary | ICD-10-CM | POA: Diagnosis not present

## 2023-06-09 DIAGNOSIS — R0902 Hypoxemia: Secondary | ICD-10-CM | POA: Diagnosis not present

## 2023-06-10 DIAGNOSIS — R0902 Hypoxemia: Secondary | ICD-10-CM | POA: Diagnosis not present

## 2023-06-10 DIAGNOSIS — E7800 Pure hypercholesterolemia, unspecified: Secondary | ICD-10-CM | POA: Diagnosis not present

## 2023-06-10 DIAGNOSIS — Z86711 Personal history of pulmonary embolism: Secondary | ICD-10-CM | POA: Diagnosis not present

## 2023-06-10 DIAGNOSIS — F419 Anxiety disorder, unspecified: Secondary | ICD-10-CM | POA: Diagnosis not present

## 2023-06-10 DIAGNOSIS — M1990 Unspecified osteoarthritis, unspecified site: Secondary | ICD-10-CM | POA: Diagnosis not present

## 2023-06-10 DIAGNOSIS — D649 Anemia, unspecified: Secondary | ICD-10-CM | POA: Diagnosis not present

## 2023-06-10 DIAGNOSIS — Z452 Encounter for adjustment and management of vascular access device: Secondary | ICD-10-CM | POA: Diagnosis not present

## 2023-06-10 DIAGNOSIS — F03C3 Unspecified dementia, severe, with mood disturbance: Secondary | ICD-10-CM | POA: Diagnosis not present

## 2023-06-10 DIAGNOSIS — Z79899 Other long term (current) drug therapy: Secondary | ICD-10-CM | POA: Diagnosis not present

## 2023-06-10 DIAGNOSIS — Z7951 Long term (current) use of inhaled steroids: Secondary | ICD-10-CM | POA: Diagnosis not present

## 2023-06-10 DIAGNOSIS — R9431 Abnormal electrocardiogram [ECG] [EKG]: Secondary | ICD-10-CM | POA: Diagnosis not present

## 2023-06-10 DIAGNOSIS — I509 Heart failure, unspecified: Secondary | ICD-10-CM | POA: Diagnosis not present

## 2023-06-10 DIAGNOSIS — Z66 Do not resuscitate: Secondary | ICD-10-CM | POA: Diagnosis not present

## 2023-06-10 DIAGNOSIS — N179 Acute kidney failure, unspecified: Secondary | ICD-10-CM | POA: Diagnosis not present

## 2023-06-10 DIAGNOSIS — K219 Gastro-esophageal reflux disease without esophagitis: Secondary | ICD-10-CM | POA: Diagnosis not present

## 2023-06-10 DIAGNOSIS — J189 Pneumonia, unspecified organism: Secondary | ICD-10-CM | POA: Diagnosis not present

## 2023-06-10 DIAGNOSIS — J9602 Acute respiratory failure with hypercapnia: Secondary | ICD-10-CM | POA: Diagnosis not present

## 2023-06-10 DIAGNOSIS — J449 Chronic obstructive pulmonary disease, unspecified: Secondary | ICD-10-CM | POA: Diagnosis not present

## 2023-06-10 DIAGNOSIS — I110 Hypertensive heart disease with heart failure: Secondary | ICD-10-CM | POA: Diagnosis not present

## 2023-06-10 DIAGNOSIS — J9601 Acute respiratory failure with hypoxia: Secondary | ICD-10-CM | POA: Diagnosis not present

## 2023-06-10 DIAGNOSIS — Z882 Allergy status to sulfonamides status: Secondary | ICD-10-CM | POA: Diagnosis not present

## 2023-06-10 DIAGNOSIS — J690 Pneumonitis due to inhalation of food and vomit: Secondary | ICD-10-CM | POA: Diagnosis not present

## 2023-06-10 DIAGNOSIS — Z8701 Personal history of pneumonia (recurrent): Secondary | ICD-10-CM | POA: Diagnosis not present

## 2023-06-10 DIAGNOSIS — J90 Pleural effusion, not elsewhere classified: Secondary | ICD-10-CM | POA: Diagnosis not present

## 2023-06-10 DIAGNOSIS — Z1152 Encounter for screening for COVID-19: Secondary | ICD-10-CM | POA: Diagnosis not present

## 2023-06-10 DIAGNOSIS — G629 Polyneuropathy, unspecified: Secondary | ICD-10-CM | POA: Diagnosis not present

## 2023-06-15 DIAGNOSIS — J45909 Unspecified asthma, uncomplicated: Secondary | ICD-10-CM | POA: Diagnosis not present

## 2023-06-15 DIAGNOSIS — J189 Pneumonia, unspecified organism: Secondary | ICD-10-CM | POA: Diagnosis not present

## 2023-06-15 DIAGNOSIS — F419 Anxiety disorder, unspecified: Secondary | ICD-10-CM | POA: Diagnosis not present

## 2023-06-15 DIAGNOSIS — Z9181 History of falling: Secondary | ICD-10-CM | POA: Diagnosis not present

## 2023-06-15 DIAGNOSIS — M6281 Muscle weakness (generalized): Secondary | ICD-10-CM | POA: Diagnosis not present

## 2023-07-06 DIAGNOSIS — J449 Chronic obstructive pulmonary disease, unspecified: Secondary | ICD-10-CM | POA: Diagnosis not present

## 2023-07-12 DIAGNOSIS — R918 Other nonspecific abnormal finding of lung field: Secondary | ICD-10-CM | POA: Diagnosis not present

## 2023-07-12 DIAGNOSIS — Z743 Need for continuous supervision: Secondary | ICD-10-CM | POA: Diagnosis not present

## 2023-07-12 DIAGNOSIS — F0390 Unspecified dementia without behavioral disturbance: Secondary | ICD-10-CM | POA: Diagnosis not present

## 2023-07-12 DIAGNOSIS — S32502A Unspecified fracture of left pubis, initial encounter for closed fracture: Secondary | ICD-10-CM | POA: Diagnosis not present

## 2023-07-12 DIAGNOSIS — I259 Chronic ischemic heart disease, unspecified: Secondary | ICD-10-CM | POA: Diagnosis not present

## 2023-07-12 DIAGNOSIS — M1612 Unilateral primary osteoarthritis, left hip: Secondary | ICD-10-CM | POA: Diagnosis not present

## 2023-07-12 DIAGNOSIS — Z96642 Presence of left artificial hip joint: Secondary | ICD-10-CM | POA: Diagnosis not present

## 2023-07-12 DIAGNOSIS — R58 Hemorrhage, not elsewhere classified: Secondary | ICD-10-CM | POA: Diagnosis not present

## 2023-07-12 DIAGNOSIS — I10 Essential (primary) hypertension: Secondary | ICD-10-CM | POA: Diagnosis not present

## 2023-07-12 DIAGNOSIS — R8589 Other abnormal findings in specimens from digestive organs and abdominal cavity: Secondary | ICD-10-CM | POA: Diagnosis not present

## 2023-07-12 DIAGNOSIS — W19XXXA Unspecified fall, initial encounter: Secondary | ICD-10-CM | POA: Diagnosis not present

## 2023-07-12 DIAGNOSIS — S0101XA Laceration without foreign body of scalp, initial encounter: Secondary | ICD-10-CM | POA: Diagnosis not present

## 2023-07-12 DIAGNOSIS — R531 Weakness: Secondary | ICD-10-CM | POA: Diagnosis not present

## 2023-07-12 DIAGNOSIS — Z043 Encounter for examination and observation following other accident: Secondary | ICD-10-CM | POA: Diagnosis not present

## 2023-07-13 DIAGNOSIS — W19XXXA Unspecified fall, initial encounter: Secondary | ICD-10-CM | POA: Diagnosis not present

## 2023-07-13 DIAGNOSIS — S0101XA Laceration without foreign body of scalp, initial encounter: Secondary | ICD-10-CM | POA: Diagnosis not present

## 2023-07-13 DIAGNOSIS — R2689 Other abnormalities of gait and mobility: Secondary | ICD-10-CM | POA: Diagnosis not present

## 2023-07-13 DIAGNOSIS — M6281 Muscle weakness (generalized): Secondary | ICD-10-CM | POA: Diagnosis not present

## 2023-07-13 DIAGNOSIS — F0390 Unspecified dementia without behavioral disturbance: Secondary | ICD-10-CM | POA: Diagnosis not present

## 2023-07-13 DIAGNOSIS — Z9181 History of falling: Secondary | ICD-10-CM | POA: Diagnosis not present

## 2023-07-20 DIAGNOSIS — S0101XS Laceration without foreign body of scalp, sequela: Secondary | ICD-10-CM | POA: Diagnosis not present

## 2023-07-20 DIAGNOSIS — F0390 Unspecified dementia without behavioral disturbance: Secondary | ICD-10-CM | POA: Diagnosis not present

## 2023-07-20 DIAGNOSIS — Z9181 History of falling: Secondary | ICD-10-CM | POA: Diagnosis not present

## 2023-07-23 DIAGNOSIS — I10 Essential (primary) hypertension: Secondary | ICD-10-CM | POA: Diagnosis not present

## 2023-07-23 DIAGNOSIS — J9622 Acute and chronic respiratory failure with hypercapnia: Secondary | ICD-10-CM | POA: Diagnosis not present

## 2023-07-23 DIAGNOSIS — E785 Hyperlipidemia, unspecified: Secondary | ICD-10-CM | POA: Diagnosis not present

## 2023-07-23 DIAGNOSIS — S0180XD Unspecified open wound of other part of head, subsequent encounter: Secondary | ICD-10-CM | POA: Diagnosis not present

## 2023-07-28 DIAGNOSIS — I7091 Generalized atherosclerosis: Secondary | ICD-10-CM | POA: Diagnosis not present

## 2023-07-28 DIAGNOSIS — B351 Tinea unguium: Secondary | ICD-10-CM | POA: Diagnosis not present

## 2023-08-03 DIAGNOSIS — F331 Major depressive disorder, recurrent, moderate: Secondary | ICD-10-CM | POA: Diagnosis not present

## 2023-08-03 DIAGNOSIS — F03B4 Unspecified dementia, moderate, with anxiety: Secondary | ICD-10-CM | POA: Diagnosis not present

## 2023-08-03 DIAGNOSIS — F5101 Primary insomnia: Secondary | ICD-10-CM | POA: Diagnosis not present

## 2023-08-06 DIAGNOSIS — J449 Chronic obstructive pulmonary disease, unspecified: Secondary | ICD-10-CM | POA: Diagnosis not present

## 2023-08-07 DIAGNOSIS — I517 Cardiomegaly: Secondary | ICD-10-CM | POA: Diagnosis not present

## 2023-08-07 DIAGNOSIS — J90 Pleural effusion, not elsewhere classified: Secondary | ICD-10-CM | POA: Diagnosis not present

## 2023-08-07 DIAGNOSIS — J986 Disorders of diaphragm: Secondary | ICD-10-CM | POA: Diagnosis not present

## 2023-08-10 DIAGNOSIS — R059 Cough, unspecified: Secondary | ICD-10-CM

## 2023-08-10 DIAGNOSIS — F419 Anxiety disorder, unspecified: Secondary | ICD-10-CM

## 2023-08-10 DIAGNOSIS — J449 Chronic obstructive pulmonary disease, unspecified: Secondary | ICD-10-CM

## 2023-08-10 DIAGNOSIS — M6281 Muscle weakness (generalized): Secondary | ICD-10-CM

## 2023-08-10 DIAGNOSIS — F0390 Unspecified dementia without behavioral disturbance: Secondary | ICD-10-CM

## 2023-08-10 DIAGNOSIS — R5381 Other malaise: Secondary | ICD-10-CM

## 2023-08-10 DIAGNOSIS — R600 Localized edema: Secondary | ICD-10-CM

## 2023-08-10 DIAGNOSIS — J45909 Unspecified asthma, uncomplicated: Secondary | ICD-10-CM

## 2023-08-10 DIAGNOSIS — J189 Pneumonia, unspecified organism: Secondary | ICD-10-CM

## 2023-08-13 DIAGNOSIS — Z1152 Encounter for screening for COVID-19: Secondary | ICD-10-CM | POA: Diagnosis not present

## 2023-08-13 DIAGNOSIS — F419 Anxiety disorder, unspecified: Secondary | ICD-10-CM | POA: Diagnosis not present

## 2023-08-13 DIAGNOSIS — R062 Wheezing: Secondary | ICD-10-CM | POA: Diagnosis not present

## 2023-08-13 DIAGNOSIS — M1990 Unspecified osteoarthritis, unspecified site: Secondary | ICD-10-CM | POA: Diagnosis not present

## 2023-08-13 DIAGNOSIS — D509 Iron deficiency anemia, unspecified: Secondary | ICD-10-CM | POA: Diagnosis not present

## 2023-08-13 DIAGNOSIS — R918 Other nonspecific abnormal finding of lung field: Secondary | ICD-10-CM | POA: Diagnosis not present

## 2023-08-13 DIAGNOSIS — J80 Acute respiratory distress syndrome: Secondary | ICD-10-CM | POA: Diagnosis not present

## 2023-08-13 DIAGNOSIS — Z882 Allergy status to sulfonamides status: Secondary | ICD-10-CM | POA: Diagnosis not present

## 2023-08-13 DIAGNOSIS — R0902 Hypoxemia: Secondary | ICD-10-CM | POA: Diagnosis not present

## 2023-08-13 DIAGNOSIS — Z66 Do not resuscitate: Secondary | ICD-10-CM | POA: Diagnosis not present

## 2023-08-13 DIAGNOSIS — J9621 Acute and chronic respiratory failure with hypoxia: Secondary | ICD-10-CM | POA: Diagnosis not present

## 2023-08-13 DIAGNOSIS — I130 Hypertensive heart and chronic kidney disease with heart failure and stage 1 through stage 4 chronic kidney disease, or unspecified chronic kidney disease: Secondary | ICD-10-CM | POA: Diagnosis not present

## 2023-08-13 DIAGNOSIS — N179 Acute kidney failure, unspecified: Secondary | ICD-10-CM | POA: Diagnosis not present

## 2023-08-13 DIAGNOSIS — N182 Chronic kidney disease, stage 2 (mild): Secondary | ICD-10-CM | POA: Diagnosis not present

## 2023-08-13 DIAGNOSIS — Z86711 Personal history of pulmonary embolism: Secondary | ICD-10-CM | POA: Diagnosis not present

## 2023-08-13 DIAGNOSIS — J90 Pleural effusion, not elsewhere classified: Secondary | ICD-10-CM | POA: Diagnosis not present

## 2023-08-13 DIAGNOSIS — R069 Unspecified abnormalities of breathing: Secondary | ICD-10-CM | POA: Diagnosis not present

## 2023-08-13 DIAGNOSIS — G929 Unspecified toxic encephalopathy: Secondary | ICD-10-CM | POA: Diagnosis not present

## 2023-08-13 DIAGNOSIS — G629 Polyneuropathy, unspecified: Secondary | ICD-10-CM | POA: Diagnosis not present

## 2023-08-13 DIAGNOSIS — F0393 Unspecified dementia, unspecified severity, with mood disturbance: Secondary | ICD-10-CM | POA: Diagnosis not present

## 2023-08-13 DIAGNOSIS — Z7951 Long term (current) use of inhaled steroids: Secondary | ICD-10-CM | POA: Diagnosis not present

## 2023-08-13 DIAGNOSIS — I5032 Chronic diastolic (congestive) heart failure: Secondary | ICD-10-CM | POA: Diagnosis not present

## 2023-08-13 DIAGNOSIS — R0689 Other abnormalities of breathing: Secondary | ICD-10-CM | POA: Diagnosis not present

## 2023-08-13 DIAGNOSIS — J440 Chronic obstructive pulmonary disease with acute lower respiratory infection: Secondary | ICD-10-CM | POA: Diagnosis not present

## 2023-08-13 DIAGNOSIS — I509 Heart failure, unspecified: Secondary | ICD-10-CM | POA: Diagnosis not present

## 2023-08-13 DIAGNOSIS — I10 Essential (primary) hypertension: Secondary | ICD-10-CM | POA: Diagnosis not present

## 2023-08-13 DIAGNOSIS — Z9981 Dependence on supplemental oxygen: Secondary | ICD-10-CM | POA: Diagnosis not present

## 2023-08-13 DIAGNOSIS — E7800 Pure hypercholesterolemia, unspecified: Secondary | ICD-10-CM | POA: Diagnosis not present

## 2023-08-13 DIAGNOSIS — J441 Chronic obstructive pulmonary disease with (acute) exacerbation: Secondary | ICD-10-CM | POA: Diagnosis not present

## 2023-08-13 DIAGNOSIS — J189 Pneumonia, unspecified organism: Secondary | ICD-10-CM | POA: Diagnosis not present

## 2023-08-13 DIAGNOSIS — Z79899 Other long term (current) drug therapy: Secondary | ICD-10-CM | POA: Diagnosis not present

## 2023-08-14 DIAGNOSIS — I509 Heart failure, unspecified: Secondary | ICD-10-CM

## 2023-08-24 DIAGNOSIS — R1310 Dysphagia, unspecified: Secondary | ICD-10-CM

## 2023-08-24 DIAGNOSIS — M6281 Muscle weakness (generalized): Secondary | ICD-10-CM

## 2023-08-24 DIAGNOSIS — I10 Essential (primary) hypertension: Secondary | ICD-10-CM

## 2023-08-24 DIAGNOSIS — D649 Anemia, unspecified: Secondary | ICD-10-CM

## 2023-08-24 DIAGNOSIS — F0390 Unspecified dementia without behavioral disturbance: Secondary | ICD-10-CM

## 2023-08-24 DIAGNOSIS — J449 Chronic obstructive pulmonary disease, unspecified: Secondary | ICD-10-CM

## 2023-08-24 DIAGNOSIS — J189 Pneumonia, unspecified organism: Secondary | ICD-10-CM

## 2023-08-24 DIAGNOSIS — J9621 Acute and chronic respiratory failure with hypoxia: Secondary | ICD-10-CM

## 2023-08-28 DIAGNOSIS — N390 Urinary tract infection, site not specified: Secondary | ICD-10-CM | POA: Diagnosis not present

## 2023-08-30 DIAGNOSIS — R051 Acute cough: Secondary | ICD-10-CM | POA: Diagnosis not present

## 2023-08-31 DIAGNOSIS — Z87440 Personal history of urinary (tract) infections: Secondary | ICD-10-CM | POA: Diagnosis not present

## 2023-08-31 DIAGNOSIS — F03B4 Unspecified dementia, moderate, with anxiety: Secondary | ICD-10-CM | POA: Diagnosis not present

## 2023-08-31 DIAGNOSIS — N390 Urinary tract infection, site not specified: Secondary | ICD-10-CM | POA: Diagnosis not present

## 2023-08-31 DIAGNOSIS — G4700 Insomnia, unspecified: Secondary | ICD-10-CM | POA: Diagnosis not present

## 2023-08-31 DIAGNOSIS — F331 Major depressive disorder, recurrent, moderate: Secondary | ICD-10-CM | POA: Diagnosis not present

## 2023-08-31 DIAGNOSIS — F0390 Unspecified dementia without behavioral disturbance: Secondary | ICD-10-CM | POA: Diagnosis not present

## 2023-08-31 DIAGNOSIS — B965 Pseudomonas (aeruginosa) (mallei) (pseudomallei) as the cause of diseases classified elsewhere: Secondary | ICD-10-CM | POA: Diagnosis not present

## 2023-08-31 DIAGNOSIS — F514 Sleep terrors [night terrors]: Secondary | ICD-10-CM | POA: Diagnosis not present

## 2023-09-06 DIAGNOSIS — J449 Chronic obstructive pulmonary disease, unspecified: Secondary | ICD-10-CM | POA: Diagnosis not present

## 2023-09-08 DIAGNOSIS — Z20822 Contact with and (suspected) exposure to covid-19: Secondary | ICD-10-CM | POA: Diagnosis not present

## 2023-09-08 DIAGNOSIS — R918 Other nonspecific abnormal finding of lung field: Secondary | ICD-10-CM | POA: Diagnosis not present

## 2023-09-08 DIAGNOSIS — I959 Hypotension, unspecified: Secondary | ICD-10-CM | POA: Diagnosis not present

## 2023-09-08 DIAGNOSIS — I509 Heart failure, unspecified: Secondary | ICD-10-CM | POA: Diagnosis not present

## 2023-09-08 DIAGNOSIS — M7989 Other specified soft tissue disorders: Secondary | ICD-10-CM | POA: Diagnosis not present

## 2023-09-08 DIAGNOSIS — R600 Localized edema: Secondary | ICD-10-CM | POA: Diagnosis not present

## 2023-09-08 DIAGNOSIS — R9431 Abnormal electrocardiogram [ECG] [EKG]: Secondary | ICD-10-CM | POA: Diagnosis not present

## 2023-09-08 DIAGNOSIS — R0603 Acute respiratory distress: Secondary | ICD-10-CM | POA: Diagnosis not present

## 2023-09-08 DIAGNOSIS — R000 Tachycardia, unspecified: Secondary | ICD-10-CM | POA: Diagnosis not present

## 2023-09-08 DIAGNOSIS — J9811 Atelectasis: Secondary | ICD-10-CM | POA: Diagnosis not present

## 2023-09-08 DIAGNOSIS — J441 Chronic obstructive pulmonary disease with (acute) exacerbation: Secondary | ICD-10-CM | POA: Diagnosis not present

## 2023-09-08 DIAGNOSIS — R051 Acute cough: Secondary | ICD-10-CM | POA: Diagnosis not present

## 2023-09-08 DIAGNOSIS — R0902 Hypoxemia: Secondary | ICD-10-CM | POA: Diagnosis not present

## 2023-09-15 DIAGNOSIS — M6281 Muscle weakness (generalized): Secondary | ICD-10-CM | POA: Diagnosis not present

## 2023-09-15 DIAGNOSIS — J45909 Unspecified asthma, uncomplicated: Secondary | ICD-10-CM | POA: Diagnosis not present

## 2023-09-15 DIAGNOSIS — D649 Anemia, unspecified: Secondary | ICD-10-CM | POA: Diagnosis not present

## 2023-09-15 DIAGNOSIS — R531 Weakness: Secondary | ICD-10-CM | POA: Diagnosis not present

## 2023-09-15 DIAGNOSIS — F0390 Unspecified dementia without behavioral disturbance: Secondary | ICD-10-CM | POA: Diagnosis not present

## 2023-09-15 DIAGNOSIS — J441 Chronic obstructive pulmonary disease with (acute) exacerbation: Secondary | ICD-10-CM | POA: Diagnosis not present

## 2023-09-15 DIAGNOSIS — R600 Localized edema: Secondary | ICD-10-CM | POA: Diagnosis not present

## 2023-09-15 DIAGNOSIS — A419 Sepsis, unspecified organism: Secondary | ICD-10-CM | POA: Diagnosis not present

## 2023-09-15 DIAGNOSIS — J189 Pneumonia, unspecified organism: Secondary | ICD-10-CM | POA: Diagnosis not present

## 2023-09-15 DIAGNOSIS — R0602 Shortness of breath: Secondary | ICD-10-CM | POA: Diagnosis not present

## 2023-09-15 DIAGNOSIS — R9431 Abnormal electrocardiogram [ECG] [EKG]: Secondary | ICD-10-CM | POA: Diagnosis not present

## 2023-09-16 DIAGNOSIS — J441 Chronic obstructive pulmonary disease with (acute) exacerbation: Secondary | ICD-10-CM | POA: Diagnosis not present

## 2023-09-16 DIAGNOSIS — F0393 Unspecified dementia, unspecified severity, with mood disturbance: Secondary | ICD-10-CM | POA: Diagnosis not present

## 2023-09-16 DIAGNOSIS — Z9981 Dependence on supplemental oxygen: Secondary | ICD-10-CM | POA: Diagnosis not present

## 2023-09-16 DIAGNOSIS — E876 Hypokalemia: Secondary | ICD-10-CM | POA: Diagnosis not present

## 2023-09-16 DIAGNOSIS — F419 Anxiety disorder, unspecified: Secondary | ICD-10-CM | POA: Diagnosis not present

## 2023-09-16 DIAGNOSIS — J9621 Acute and chronic respiratory failure with hypoxia: Secondary | ICD-10-CM | POA: Diagnosis not present

## 2023-09-16 DIAGNOSIS — M1990 Unspecified osteoarthritis, unspecified site: Secondary | ICD-10-CM | POA: Diagnosis not present

## 2023-09-16 DIAGNOSIS — I509 Heart failure, unspecified: Secondary | ICD-10-CM | POA: Diagnosis not present

## 2023-09-16 DIAGNOSIS — J9612 Chronic respiratory failure with hypercapnia: Secondary | ICD-10-CM | POA: Diagnosis not present

## 2023-09-16 DIAGNOSIS — D631 Anemia in chronic kidney disease: Secondary | ICD-10-CM | POA: Diagnosis not present

## 2023-09-16 DIAGNOSIS — A419 Sepsis, unspecified organism: Secondary | ICD-10-CM | POA: Diagnosis not present

## 2023-09-16 DIAGNOSIS — E7800 Pure hypercholesterolemia, unspecified: Secondary | ICD-10-CM | POA: Diagnosis not present

## 2023-09-16 DIAGNOSIS — N1832 Chronic kidney disease, stage 3b: Secondary | ICD-10-CM | POA: Diagnosis not present

## 2023-09-16 DIAGNOSIS — R9431 Abnormal electrocardiogram [ECG] [EKG]: Secondary | ICD-10-CM | POA: Diagnosis not present

## 2023-09-16 DIAGNOSIS — Z1152 Encounter for screening for COVID-19: Secondary | ICD-10-CM | POA: Diagnosis not present

## 2023-09-16 DIAGNOSIS — I130 Hypertensive heart and chronic kidney disease with heart failure and stage 1 through stage 4 chronic kidney disease, or unspecified chronic kidney disease: Secondary | ICD-10-CM | POA: Diagnosis not present

## 2023-09-16 DIAGNOSIS — Z882 Allergy status to sulfonamides status: Secondary | ICD-10-CM | POA: Diagnosis not present

## 2023-09-16 DIAGNOSIS — I10 Essential (primary) hypertension: Secondary | ICD-10-CM | POA: Diagnosis not present

## 2023-09-16 DIAGNOSIS — K219 Gastro-esophageal reflux disease without esophagitis: Secondary | ICD-10-CM | POA: Diagnosis not present

## 2023-09-16 DIAGNOSIS — Z79899 Other long term (current) drug therapy: Secondary | ICD-10-CM | POA: Diagnosis not present

## 2023-09-16 DIAGNOSIS — Z7951 Long term (current) use of inhaled steroids: Secondary | ICD-10-CM | POA: Diagnosis not present

## 2023-09-16 DIAGNOSIS — Z7401 Bed confinement status: Secondary | ICD-10-CM | POA: Diagnosis not present

## 2023-09-16 DIAGNOSIS — Z86711 Personal history of pulmonary embolism: Secondary | ICD-10-CM | POA: Diagnosis not present

## 2023-09-16 DIAGNOSIS — J440 Chronic obstructive pulmonary disease with acute lower respiratory infection: Secondary | ICD-10-CM | POA: Diagnosis not present

## 2023-09-16 DIAGNOSIS — J189 Pneumonia, unspecified organism: Secondary | ICD-10-CM | POA: Diagnosis not present

## 2023-09-16 DIAGNOSIS — J159 Unspecified bacterial pneumonia: Secondary | ICD-10-CM | POA: Diagnosis not present

## 2023-09-18 DIAGNOSIS — I10 Essential (primary) hypertension: Secondary | ICD-10-CM | POA: Diagnosis not present

## 2023-09-18 DIAGNOSIS — Z7401 Bed confinement status: Secondary | ICD-10-CM | POA: Diagnosis not present

## 2023-09-21 DIAGNOSIS — J449 Chronic obstructive pulmonary disease, unspecified: Secondary | ICD-10-CM

## 2023-09-21 DIAGNOSIS — D649 Anemia, unspecified: Secondary | ICD-10-CM

## 2023-09-21 DIAGNOSIS — F418 Other specified anxiety disorders: Secondary | ICD-10-CM

## 2023-09-21 DIAGNOSIS — K219 Gastro-esophageal reflux disease without esophagitis: Secondary | ICD-10-CM

## 2023-09-21 DIAGNOSIS — J189 Pneumonia, unspecified organism: Secondary | ICD-10-CM

## 2023-09-21 DIAGNOSIS — I129 Hypertensive chronic kidney disease with stage 1 through stage 4 chronic kidney disease, or unspecified chronic kidney disease: Secondary | ICD-10-CM | POA: Diagnosis not present

## 2023-09-21 DIAGNOSIS — N1832 Chronic kidney disease, stage 3b: Secondary | ICD-10-CM

## 2023-09-21 DIAGNOSIS — F0390 Unspecified dementia without behavioral disturbance: Secondary | ICD-10-CM

## 2023-09-21 DIAGNOSIS — M6281 Muscle weakness (generalized): Secondary | ICD-10-CM

## 2023-09-28 DIAGNOSIS — F03B4 Unspecified dementia, moderate, with anxiety: Secondary | ICD-10-CM | POA: Diagnosis not present

## 2023-09-28 DIAGNOSIS — F411 Generalized anxiety disorder: Secondary | ICD-10-CM | POA: Diagnosis not present

## 2023-09-28 DIAGNOSIS — F331 Major depressive disorder, recurrent, moderate: Secondary | ICD-10-CM | POA: Diagnosis not present

## 2023-09-28 DIAGNOSIS — G4700 Insomnia, unspecified: Secondary | ICD-10-CM | POA: Diagnosis not present

## 2023-10-04 DIAGNOSIS — J449 Chronic obstructive pulmonary disease, unspecified: Secondary | ICD-10-CM | POA: Diagnosis not present

## 2023-10-24 DEATH — deceased
